# Patient Record
Sex: Male | Born: 1975
Health system: Southern US, Community
[De-identification: ages and names within clinical notes are randomized; demographics above are authoritative.]

## PROBLEM LIST (undated history)

## (undated) DIAGNOSIS — F1510 Other stimulant abuse, uncomplicated: Secondary | ICD-10-CM

## (undated) HISTORY — PX: KNEE ARTHROSCOPY W/ MENISCAL REPAIR: SHX1877

## (undated) HISTORY — PX: EYE SURGERY: SHX253

## (undated) HISTORY — PX: APPENDECTOMY: SHX54

---

## 2000-11-19 ENCOUNTER — Emergency Department (HOSPITAL_COMMUNITY): Admission: EM | Admit: 2000-11-19 | Discharge: 2000-11-19 | Payer: Self-pay | Admitting: Emergency Medicine

## 2001-02-01 ENCOUNTER — Emergency Department (HOSPITAL_COMMUNITY): Admission: EM | Admit: 2001-02-01 | Discharge: 2001-02-01 | Payer: Self-pay | Admitting: Emergency Medicine

## 2001-02-22 ENCOUNTER — Emergency Department (HOSPITAL_COMMUNITY): Admission: EM | Admit: 2001-02-22 | Discharge: 2001-02-22 | Payer: Self-pay | Admitting: Emergency Medicine

## 2002-08-04 ENCOUNTER — Emergency Department (HOSPITAL_COMMUNITY): Admission: EM | Admit: 2002-08-04 | Discharge: 2002-08-04 | Payer: Self-pay

## 2002-08-08 ENCOUNTER — Ambulatory Visit (HOSPITAL_COMMUNITY): Admission: RE | Admit: 2002-08-08 | Discharge: 2002-08-08 | Payer: Self-pay | Admitting: Family Medicine

## 2006-05-01 ENCOUNTER — Emergency Department (HOSPITAL_COMMUNITY): Admission: EM | Admit: 2006-05-01 | Discharge: 2006-05-01 | Payer: Self-pay | Admitting: Emergency Medicine

## 2017-01-11 ENCOUNTER — Emergency Department (HOSPITAL_BASED_OUTPATIENT_CLINIC_OR_DEPARTMENT_OTHER)
Admission: EM | Admit: 2017-01-11 | Discharge: 2017-01-11 | Disposition: A | Discharge status: Home / Self Care | Payer: Self-pay | Attending: Emergency Medicine | Admitting: Emergency Medicine

## 2017-01-11 ENCOUNTER — Encounter (HOSPITAL_BASED_OUTPATIENT_CLINIC_OR_DEPARTMENT_OTHER): Payer: Self-pay | Admitting: Emergency Medicine

## 2017-01-11 DIAGNOSIS — F17220 Nicotine dependence, chewing tobacco, uncomplicated: Secondary | ICD-10-CM | POA: Insufficient documentation

## 2017-01-11 DIAGNOSIS — M545 Low back pain, unspecified: Secondary | ICD-10-CM

## 2017-01-11 MED ORDER — CYCLOBENZAPRINE HCL 10 MG PO TABS: 10.0000 mg | ORAL_TABLET | Freq: Three times a day (TID) | ORAL | 0 refills | Status: DC | PRN

## 2017-01-11 MED ORDER — NAPROXEN 500 MG PO TABS: 500.0000 mg | ORAL_TABLET | Freq: Two times a day (BID) | ORAL | 0 refills | Status: DC

## 2017-01-11 NOTE — ED Notes (Addendum)
c/o mid and R lumbar back pain gradually progressively worse over last 3 days, also reports some tingling in R thigh. Relates sx to aggravated by work and playing with kids (denies: injury, loss of control of bowel or bladder, radiation, numbness, abd pain, nvd, fever or other sx), temp in triage noted to be 99.9. Pt guarding movements, guarding and bracing movements. Ambulatory with slow cautious steady gait. BIB friend. Took aleve PTA w/o change.   Alert, amiable, NAD, calm, interactive, resps e/u, speaking in clear complete sentences, no dyspnea noted, skin W&D, VSS.

## 2017-01-11 NOTE — ED Notes (Signed)
Pt discharged to home NAD.  

## 2017-01-11 NOTE — ED Notes (Addendum)
[facility] not [facility].

## 2017-01-11 NOTE — ED Notes (Signed)
ED Provider at bedside. 

## 2017-01-11 NOTE — ED Provider Notes (Signed)
[facility] Note   CSN: [id number] Arrival date & time: 01/11/17  1941  By signing my name below, I, [name], attest that this documentation has been prepared under the direction and in the presence of Geoffery Lyons, MD . Electronically Signed: [name], ED Scribe. 01/11/2017. 8:30 PM.    History   Chief Complaint Chief Complaint  Patient presents with  . Back Pain    The history is provided by the patient. No language interpreter was used.  Back Pain   This is a new problem. The current episode started more than 2 days ago. The problem occurs constantly. The problem has been gradually worsening. The pain is associated with no known injury. The pain does not radiate. Pertinent negatives include no bowel incontinence, no bladder incontinence, no dysuria and no weakness. Treatments tried: Aleve. The treatment provided no relief.   HPI Comments:  Steven Pace is a 41 y.o. male who presents to the Emergency Department complaining of constant back pain x 3 days. Pt states that yesterday he was playing football outside and the pain worsened, and he also reports that he was feeding his dog he felt the pain shoot through his back again. Pt complains of associated tingling in his legs. 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 He has taken aleve with no relief. Pt denies urinary symptoms, incontinence, weakness.   History reviewed. No pertinent past medical history.  There are no active problems to display for this patient.   Past Surgical History:  Procedure Laterality Date  . EYE SURGERY         Home Medications    Prior to Admission medications   Not on File    Family History History reviewed. No pertinent family history.  Social History Social History  Substance Use Topics  . Smoking status: Never Smoker  . Smokeless  tobacco: Current User    Types: Chew  . Alcohol use No     Allergies   Ambien [zolpidem tartrate]   Review of Systems Review of Systems  Gastrointestinal: Negative for bowel incontinence.  Genitourinary: Negative for bladder incontinence and dysuria.  Musculoskeletal: Positive for back pain.  Neurological: Negative for weakness.     Physical Exam Updated Vital Signs BP 123/88 (BP Location: Right Arm)   Pulse 100   Temp 99.9 F (37.7 C) (Oral)   Resp 18   Ht 5\' 9"  (1.753 m)   Wt 163 lb (73.9 kg)   SpO2 99%   BMI 24.07 kg/m   Physical Exam  Constitutional: He appears well-developed and well-nourished. No distress.  HENT:  Head: Normocephalic and atraumatic.  Eyes: Conjunctivae are normal.  Cardiovascular: Normal rate.   Pulmonary/Chest: Effort normal.  Abdominal: He exhibits no distension.  Musculoskeletal:  Mild TTP in the soft tissues of lumbar region.   Neurological: He is alert.  DTRs 2+ and symmetrical in both lower extremities. Strength 5/5 both lower extremities. Able to ambulate on heels and toes without difficulty.   Skin: Skin is warm and dry.  Psychiatric: He has a normal mood and affect.  Nursing note and vitals reviewed.    ED Treatments / Results  DIAGNOSTIC STUDIES:  Oxygen Saturation is 99% on RA, normal by my interpretation.    COORDINATION OF CARE:  8:30 PM Discussed treatment plan with pt at bedside and pt  agreed to plan.   Labs (all labs ordered are listed, but only abnormal results are displayed) Labs Reviewed - No data to display  EKG  EKG Interpretation None       Radiology No results found.  Procedures Procedures (including critical care time)  Medications Ordered in ED Medications - No data to display   Initial Impression / Assessment and Plan / ED Course  I have reviewed the triage vital signs and the nursing notes.  Pertinent labs & imaging results that were available during my care of the patient were  reviewed by me and considered in my medical decision making (see chart for details).  Patient presents with complaints of back pain that began in the absence of any injury or trauma. There is nothing that would suggest an emergent situation. There is no weakness and reflexes are symmetrical. There are no bowel or bladder complaints. He will be treated with anti-inflammatories, pain medication, and is to follow-up with primary Dr. if not improving in the next week.  Final Clinical Impressions(s) / ED Diagnoses   Final diagnoses:  None    New Prescriptions New Prescriptions   No medications on file  I personally performed the services described in this documentation, which was scribed in my presence. The recorded information has been reviewed and is accurate.        Geoffery Lyonsouglas Anam Bobby, MD 01/11/17 2306

## 2017-01-11 NOTE — Discharge Instructions (Signed)
Naproxen as prescribed. ° °Flexeril as prescribed as needed for pain not relieved with naproxen. ° °Follow-up with your primary Dr. if you're not improving in the next week. °

## 2017-10-06 ENCOUNTER — Encounter (HOSPITAL_BASED_OUTPATIENT_CLINIC_OR_DEPARTMENT_OTHER): Payer: Self-pay | Admitting: *Deleted

## 2017-10-06 ENCOUNTER — Emergency Department (HOSPITAL_BASED_OUTPATIENT_CLINIC_OR_DEPARTMENT_OTHER)
Admission: EM | Admit: 2017-10-06 | Discharge: 2017-10-06 | Disposition: A | Discharge status: Home / Self Care | Payer: Self-pay | Attending: Emergency Medicine | Admitting: Emergency Medicine

## 2017-10-06 ENCOUNTER — Other Ambulatory Visit: Payer: Self-pay

## 2017-10-06 ENCOUNTER — Emergency Department (HOSPITAL_BASED_OUTPATIENT_CLINIC_OR_DEPARTMENT_OTHER): Payer: Self-pay

## 2017-10-06 DIAGNOSIS — R05 Cough: Secondary | ICD-10-CM | POA: Insufficient documentation

## 2017-10-06 DIAGNOSIS — R112 Nausea with vomiting, unspecified: Secondary | ICD-10-CM | POA: Insufficient documentation

## 2017-10-06 DIAGNOSIS — J029 Acute pharyngitis, unspecified: Secondary | ICD-10-CM | POA: Insufficient documentation

## 2017-10-06 DIAGNOSIS — R059 Cough, unspecified: Secondary | ICD-10-CM

## 2017-10-06 DIAGNOSIS — R42 Dizziness and giddiness: Secondary | ICD-10-CM | POA: Insufficient documentation

## 2017-10-06 DIAGNOSIS — F17220 Nicotine dependence, chewing tobacco, uncomplicated: Secondary | ICD-10-CM | POA: Insufficient documentation

## 2017-10-06 DIAGNOSIS — R1084 Generalized abdominal pain: Secondary | ICD-10-CM | POA: Insufficient documentation

## 2017-10-06 LAB — COMPREHENSIVE METABOLIC PANEL
ALK PHOS: 77 U/L (ref 38–126)
ALT: 12 U/L — AB (ref 17–63)
ANION GAP: 6 (ref 5–15)
AST: 14 U/L — ABNORMAL LOW (ref 15–41)
Albumin: 4.4 g/dL (ref 3.5–5.0)
BUN: 11 mg/dL (ref 6–20)
CALCIUM: 9.1 mg/dL (ref 8.9–10.3)
CHLORIDE: 102 mmol/L (ref 101–111)
CO2: 27 mmol/L (ref 22–32)
CREATININE: 0.82 mg/dL (ref 0.61–1.24)
GFR calc Af Amer: >60 mL/min (ref >60)
Glucose, Bld: 107 mg/dL — ABNORMAL HIGH (ref 65–99)
Potassium: 3.2 mmol/L — ABNORMAL LOW (ref 3.5–5.1)
SODIUM: 135 mmol/L (ref 135–145)
Total Bilirubin: 0.5 mg/dL (ref 0.3–1.2)
Total Protein: 7.5 g/dL (ref 6.5–8.1)

## 2017-10-06 LAB — CBC WITH DIFFERENTIAL/PLATELET
Band Neutrophils: 0 %
Basophils Absolute: 0.1 10*3/uL (ref 0.0–0.1)
Basophils Relative: 1 %
Blasts: 0 %
Eosinophils Absolute: 0.1 10*3/uL (ref 0.0–0.7)
Eosinophils Relative: 1 %
HEMATOCRIT: 39.4 % (ref 39.0–52.0)
HEMOGLOBIN: 13.5 g/dL (ref 13.0–17.0)
LYMPHS PCT: 18 %
Lymphs Abs: 0.9 10*3/uL (ref 0.7–4.0)
MCH: 32.1 pg (ref 26.0–34.0)
MCHC: 34.3 g/dL (ref 30.0–36.0)
MCV: 93.6 fL (ref 78.0–100.0)
MYELOCYTES: 0 %
Metamyelocytes Relative: 0 %
Monocytes Absolute: 0.2 10*3/uL (ref 0.1–1.0)
Monocytes Relative: 3 %
NEUTROS ABS: 3.8 10*3/uL (ref 1.7–7.7)
NEUTROS PCT: 77 %
NRBC: 0 /100{WBCs}
PROMYELOCYTES ABS: 0 %
Platelets: 272 10*3/uL (ref 150–400)
RBC: 4.21 MIL/uL — AB (ref 4.22–5.81)
RDW: 12 % (ref 11.5–15.5)
WBC: 5.1 10*3/uL (ref 4.0–10.5)

## 2017-10-06 LAB — TROPONIN I: Troponin I: <0.03 ng/mL (ref <0.03)

## 2017-10-06 LAB — CBG MONITORING, ED: Glucose-Capillary: 98 mg/dL (ref 65–99)

## 2017-10-06 MED ORDER — SODIUM CHLORIDE 0.9 % IV BOLUS (SEPSIS)
1000.0000 mL | Freq: Once | INTRAVENOUS | Status: AC
  Administered 2017-10-06: 1000 mL via INTRAVENOUS

## 2017-10-06 MED ORDER — ONDANSETRON HCL 4 MG/2ML IJ SOLN: 4.0000 mg | Freq: Once | INTRAMUSCULAR | Status: DC

## 2017-10-06 MED ORDER — METOCLOPRAMIDE HCL 10 MG PO TABS: 10.0000 mg | ORAL_TABLET | Freq: Four times a day (QID) | ORAL | 0 refills | Status: DC

## 2017-10-06 MED ORDER — DICYCLOMINE HCL 10 MG PO CAPS
10.0000 mg | ORAL_CAPSULE | Freq: Once | ORAL | Status: AC
  Administered 2017-10-06: 10 mg via ORAL
  Filled 2017-10-06: qty 1

## 2017-10-06 MED ORDER — METOCLOPRAMIDE HCL 5 MG/ML IJ SOLN
10.0000 mg | Freq: Once | INTRAMUSCULAR | Status: AC
  Administered 2017-10-06: 10 mg via INTRAVENOUS
  Filled 2017-10-06: qty 2

## 2017-10-06 MED ORDER — DICYCLOMINE HCL 20 MG PO TABS: 20.0000 mg | ORAL_TABLET | Freq: Two times a day (BID) | ORAL | 0 refills | Status: DC

## 2017-10-06 MED ORDER — IBUPROFEN 800 MG PO TABS: 800.0000 mg | ORAL_TABLET | Freq: Three times a day (TID) | ORAL | 0 refills | Status: DC | PRN

## 2017-10-06 MED FILL — IBUPROFEN 800 MG TAB: 800 | 7 days supply | Qty: 21 | Fill #0

## 2017-10-06 MED FILL — DICYCLOMINE 20 MG TABLET: 20 | 10 days supply | Qty: 20 | Fill #0

## 2017-10-06 MED FILL — METOCLOPRAMIDE 10 MG TABLET: 10 | 7 days supply | Qty: 30 | Fill #0

## 2017-10-06 NOTE — ED Provider Notes (Signed)
Emergency Department Provider Note   I have reviewed the triage vital signs and the nursing notes.   HISTORY  Chief Complaint Cough   HPI Steven Pace is a 41 y.o. male presents to the emergency department for evaluation of flulike symptoms including sore throat, nausea, vomiting, symptoms have been ongoing for the past several days but became significantly worse yesterday while the patient was at work.  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  His partner at bedside states that he has had nausea and vomiting for couple of days but became much worse yesterday.  He continues to have cough but denies chest pain or difficulty breathing.  He initially went to urgent care where he tested negative for the flu and strep.  He was referred to the emergency department because of looking slightly pale and breathing abnormally, according to the patient. No sick contacts.    History reviewed. No pertinent past medical history.  There are no active problems to display for this patient.   Past Surgical History:  Procedure Laterality Date  . EYE SURGERY      Current Outpatient Rx  . Order #: [id number] Class: Print  . Order #: [id number] Class: Print  . Order #: [id number] Class: Print  . Order #: [id number] Class: Print  . Order #: [id number] Class: Print    Allergies Ambien [zolpidem tartrate]  No family history on file.  Social History Social History   Tobacco Use  . Smoking status: Never Smoker  . Smokeless tobacco: Current User    Types: Chew  Substance Use Topics  . Alcohol use: No  . Drug use: No    Review of Systems  Constitutional: Positive subjective fever/chills. Positive fatigue.  Eyes: No visual changes. ENT: Positive sore throat. Cardiovascular: Denies chest pain. Respiratory: Denies shortness of breath. Gastrointestinal: Positive diffuse burning abdominal pain. Positive nausea and vomiting.  No  diarrhea.  No constipation. Genitourinary: Negative for dysuria. Musculoskeletal: Negative for back pain. Skin: Negative for rash. Neurological: Negative for headaches, focal weakness or numbness.  10-point ROS otherwise negative.  ____________________________________________   PHYSICAL EXAM:  VITAL SIGNS: ED Triage Vitals  Enc Vitals Group     BP 10/06/17 1131 111/76     Pulse Rate 10/06/17 1131 (!) 101     Resp 10/06/17 1131 (!) 22     Temp 10/06/17 1131 97.9 F (36.6 C)     Temp Source 10/06/17 1131 Oral     SpO2 10/06/17 1131 100 %     Weight 10/06/17 1134 147 lb (66.7 kg)     Height 10/06/17 1134 5\' 9"  (1.753 m)     Pain Score 10/06/17 1133 5   Constitutional: Alert and oriented. Well appearing and in no acute distress. Eyes: Conjunctivae are normal.  Head: Atraumatic. Nose: No congestion/rhinnorhea. Mouth/Throat: Mucous membranes are slightly dry.  Oropharynx non-erythematous. Neck: No stridor.  No meningeal signs. Cardiovascular: Tachycardia. Good peripheral circulation. Grossly normal heart sounds.   Respiratory: Normal respiratory effort.  No retractions. Lungs CTAB. Gastrointestinal: Soft with diffuse mild tenderness. No focal tenderness, rebound, or guarding. No distention.  Musculoskeletal: No lower extremity tenderness nor edema. No gross deformities of extremities. Neurologic:  Normal speech and language. No gross focal neurologic deficits are appreciated.  Skin:  Skin is warm, dry and intact. No rash noted.  ____________________________________________   LABS (all labs ordered are listed, but only abnormal results are displayed)  Labs Reviewed  COMPREHENSIVE  METABOLIC PANEL - Abnormal; Notable for the following components:      Result Value   Potassium 3.2 (*)    Glucose, Bld 107 (*)    AST 14 (*)    ALT 12 (*)    All other components within normal limits  CBC WITH DIFFERENTIAL/PLATELET - Abnormal; Notable for the following components:   RBC 4.21  (*)    All other components within normal limits  TROPONIN I  CBG MONITORING, ED   ____________________________________________  EKG   EKG Interpretation  Date/Time:  Tuesday October 06 2017 13:51:15 EST Ventricular Rate:  87 PR Interval:    QRS Duration: 100 QT Interval:  359 QTC Calculation: 432 R Axis:   63 Text Interpretation:  Sinus rhythm ST elev, probable normal early repol pattern Baseline wander in lead(s) V5 V6 No STEMI.  Confirmed by Alona Bene [phone]) on 10/06/2017 7:13:31 PM       ____________________________________________  RADIOLOGY  Dg Chest 2 View  Result Date: 10/06/2017 CLINICAL DATA:  41 year old male with cough for 4 days. EXAM: CHEST  2 VIEW COMPARISON:  CT chest abdomen and pelvis 08/06/2017. FINDINGS: Stable large lung volumes. Mediastinal contours remain normal. Visualized tracheal air column is within normal limits. No pneumothorax, pulmonary edema, pleural effusion or confluent pulmonary opacity. Mild bilateral increased interstitial markings appear stable. No acute osseous abnormality identified. Negative visible bowel gas pattern. IMPRESSION: Mild pulmonary hyperinflation. No acute cardiopulmonary abnormality. Electronically Signed   By: Odessa Fleming M.D.   On: 10/06/2017 12:02    ____________________________________________   PROCEDURES  Procedure(s) performed:   Procedures  None ____________________________________________   INITIAL IMPRESSION / ASSESSMENT AND PLAN / ED COURSE  Pertinent labs & imaging results that were available during my care of the patient were reviewed by me and considered in my medical decision making (see chart for details).  Patient presents emergency department for evaluation of flulike symptoms worsening yesterday.  No focal abdominal tenderness.  Labs are largely marked tachycardia.  He is afebrile with normal blood pressure.  Plan for IV fluids, orthostatic vital signs, labs, and EKG.  The patient had a chest  x-ray ordered from triage which is unremarkable.   03:20 PM Patient feeling much better after IVF. Tolerating PO in the ED. Plan for discharge with supportive care plan and PCP follow up as needed.   At this time, I do not feel there is any life-threatening condition present. I have reviewed and discussed all results (EKG, imaging, lab, urine as appropriate), exam findings with patient. I have reviewed nursing notes and appropriate previous records.  I feel the patient is safe to be discharged home without further emergent workup. Discussed usual and customary return precautions. Patient and family (if present) verbalize understanding and are comfortable with this plan.  Patient will follow-up with their primary care provider. If they do not have a primary care provider, information for follow-up has been provided to them. All questions have been answered.  ____________________________________________  FINAL CLINICAL IMPRESSION(S) / ED DIAGNOSES  Final diagnoses:  Generalized abdominal pain  Non-intractable vomiting with nausea, unspecified vomiting type  Cough     MEDICATIONS GIVEN DURING THIS VISIT:  Medications  sodium chloride 0.9 % bolus 1,000 mL (0 mLs Intravenous Stopped 10/06/17 1435)  metoCLOPramide (REGLAN) injection 10 mg (10 mg Intravenous Given 10/06/17 1351)  dicyclomine (BENTYL) capsule 10 mg (10 mg Oral Given 10/06/17 1351)  sodium chloride 0.9 % bolus 1,000 mL (0 mLs Intravenous Stopped 10/06/17 1534)  Note:  This document was prepared using Dragon voice recognition software and may include unintentional dictation errors.  Alona BeneJoshua Margrett Kalb, MD Emergency Medicine    Cherri Yera, Arlyss RepressJoshua G, MD 10/06/17 [phone]

## 2017-10-06 NOTE — Discharge Instructions (Signed)
You have been seen in the Emergency Department (ED) today for nausea and vomiting.  Your work up today has not shown a clear cause for your symptoms. °You have been prescribed Reglan; please use as prescribed as needed for your nausea. ° °Follow up with your doctor as soon as possible regarding today?s emergent visit and your symptoms of nausea.  ° °Return to the ED if you develop abdominal, bloody vomiting, bloody diarrhea, if you are unable to tolerate fluids due to vomiting, or if you develop other symptoms that concern you. ° °

## 2017-10-06 NOTE — ED Triage Notes (Addendum)
[facility] was seen at [facility] this am for cough and nausea. He had a negative flu and strep. He vomited at work yesterday. He looks pale. He was told to come here for further evaluation.

## 2017-10-14 ENCOUNTER — Emergency Department (HOSPITAL_BASED_OUTPATIENT_CLINIC_OR_DEPARTMENT_OTHER): Admission: EM | Admit: 2017-10-14 | Discharge: 2017-10-14 | Discharge status: Against Medical Advice | Payer: Self-pay

## 2017-10-14 NOTE — ED Notes (Signed)
Pt states he will go to his md in am  Pt did not want to wait after seeing lobby, pt was told that we would see him asap,  Was told if anything changed to come back

## 2018-07-19 ENCOUNTER — Emergency Department (HOSPITAL_BASED_OUTPATIENT_CLINIC_OR_DEPARTMENT_OTHER): Payer: Self-pay

## 2018-07-19 ENCOUNTER — Other Ambulatory Visit: Payer: Self-pay

## 2018-07-19 ENCOUNTER — Encounter (HOSPITAL_BASED_OUTPATIENT_CLINIC_OR_DEPARTMENT_OTHER): Payer: Self-pay | Admitting: *Deleted

## 2018-07-19 ENCOUNTER — Emergency Department (HOSPITAL_BASED_OUTPATIENT_CLINIC_OR_DEPARTMENT_OTHER)
Admission: EM | Admit: 2018-07-19 | Discharge: 2018-07-19 | Disposition: A | Discharge status: Home / Self Care | Payer: Self-pay | Attending: Emergency Medicine | Admitting: Emergency Medicine

## 2018-07-19 DIAGNOSIS — M25561 Pain in right knee: Secondary | ICD-10-CM | POA: Insufficient documentation

## 2018-07-19 DIAGNOSIS — Z5321 Procedure and treatment not carried out due to patient leaving prior to being seen by health care provider: Secondary | ICD-10-CM | POA: Insufficient documentation

## 2018-07-19 NOTE — ED Notes (Signed)
Patient transported to X-ray 

## 2018-07-19 NOTE — ED Triage Notes (Addendum)
Pt co right knee pain w/ injury x 1 hr ago

## 2018-10-21 ENCOUNTER — Emergency Department (HOSPITAL_BASED_OUTPATIENT_CLINIC_OR_DEPARTMENT_OTHER)
Admission: EM | Admit: 2018-10-21 | Discharge: 2018-10-22 | Disposition: A | Discharge status: Home / Self Care | Payer: Self-pay | Attending: Emergency Medicine | Admitting: Emergency Medicine

## 2018-10-21 ENCOUNTER — Encounter (HOSPITAL_BASED_OUTPATIENT_CLINIC_OR_DEPARTMENT_OTHER): Payer: Self-pay | Admitting: Emergency Medicine

## 2018-10-21 ENCOUNTER — Other Ambulatory Visit: Payer: Self-pay

## 2018-10-21 DIAGNOSIS — F32A Depression, unspecified: Secondary | ICD-10-CM

## 2018-10-21 DIAGNOSIS — Z046 Encounter for general psychiatric examination, requested by authority: Secondary | ICD-10-CM | POA: Insufficient documentation

## 2018-10-21 DIAGNOSIS — F329 Major depressive disorder, single episode, unspecified: Secondary | ICD-10-CM | POA: Insufficient documentation

## 2018-10-21 DIAGNOSIS — F17220 Nicotine dependence, chewing tobacco, uncomplicated: Secondary | ICD-10-CM | POA: Insufficient documentation

## 2018-10-21 DIAGNOSIS — Z79899 Other long term (current) drug therapy: Secondary | ICD-10-CM | POA: Insufficient documentation

## 2018-10-21 DIAGNOSIS — R410 Disorientation, unspecified: Secondary | ICD-10-CM | POA: Insufficient documentation

## 2018-10-21 HISTORY — DX: Other stimulant abuse, uncomplicated: F15.10
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
## 2018-10-21 NOTE — ED Triage Notes (Signed)
Pt states he is "not good enough"  Pt states he is having problems and feels like he is not good enough for anyone  Pt denies feeling suicidal but states everyone would be better off if he was not here  Pt is tearful in triage

## 2018-10-22 NOTE — ED Notes (Signed)
TTS talking to pt. 

## 2018-10-22 NOTE — ED Notes (Signed)
Feels like he is a burden to every one  Thinks he would be better off not to be alive,  States he is not suicidal  States he does not have the guts to hurt himself or any one else  Denies having a plan to hurt self
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
## 2019-04-29 ENCOUNTER — Emergency Department (HOSPITAL_BASED_OUTPATIENT_CLINIC_OR_DEPARTMENT_OTHER): Payer: Self-pay

## 2019-04-29 ENCOUNTER — Emergency Department (HOSPITAL_BASED_OUTPATIENT_CLINIC_OR_DEPARTMENT_OTHER)
Admission: EM | Admit: 2019-04-29 | Discharge: 2019-04-29 | Disposition: A | Discharge status: Home / Self Care | Payer: Self-pay | Attending: Emergency Medicine | Admitting: Emergency Medicine

## 2019-04-29 ENCOUNTER — Encounter (HOSPITAL_BASED_OUTPATIENT_CLINIC_OR_DEPARTMENT_OTHER): Payer: Self-pay | Admitting: Emergency Medicine

## 2019-04-29 ENCOUNTER — Other Ambulatory Visit: Payer: Self-pay

## 2019-04-29 DIAGNOSIS — Z79899 Other long term (current) drug therapy: Secondary | ICD-10-CM | POA: Insufficient documentation

## 2019-04-29 DIAGNOSIS — S838X1A Sprain of other specified parts of right knee, initial encounter: Secondary | ICD-10-CM

## 2019-04-29 DIAGNOSIS — Y9289 Other specified places as the place of occurrence of the external cause: Secondary | ICD-10-CM | POA: Insufficient documentation

## 2019-04-29 DIAGNOSIS — S8391XA Sprain of unspecified site of right knee, initial encounter: Secondary | ICD-10-CM | POA: Insufficient documentation

## 2019-04-29 DIAGNOSIS — W19XXXA Unspecified fall, initial encounter: Secondary | ICD-10-CM | POA: Insufficient documentation

## 2019-04-29 DIAGNOSIS — S93401A Sprain of unspecified ligament of right ankle, initial encounter: Secondary | ICD-10-CM | POA: Insufficient documentation

## 2019-04-29 DIAGNOSIS — F17290 Nicotine dependence, other tobacco product, uncomplicated: Secondary | ICD-10-CM | POA: Insufficient documentation

## 2019-04-29 DIAGNOSIS — Y999 Unspecified external cause status: Secondary | ICD-10-CM | POA: Insufficient documentation

## 2019-04-29 DIAGNOSIS — Y9389 Activity, other specified: Secondary | ICD-10-CM | POA: Insufficient documentation

## 2019-04-29 MED ORDER — ACETAMINOPHEN 325 MG PO TABS
650.0000 mg | ORAL_TABLET | Freq: Once | ORAL | Status: AC
  Administered 2019-04-29: 650 mg via ORAL
  Filled 2019-04-29: qty 2

## 2019-04-29 MED ORDER — IBUPROFEN 600 MG PO TABS: 600.0000 mg | ORAL_TABLET | Freq: Four times a day (QID) | ORAL | 0 refills | Status: DC | PRN

## 2019-04-29 MED ORDER — NAPROXEN 250 MG PO TABS
500.0000 mg | ORAL_TABLET | Freq: Once | ORAL | Status: AC
  Administered 2019-04-29: 01:00:00 500 mg via ORAL
  Filled 2019-04-29: qty 2

## 2019-04-29 NOTE — ED Notes (Signed)
PT resting, states does not have a ride until 0600.

## 2019-04-29 NOTE — ED Notes (Signed)
Pt waiting for ride after 7am

## 2019-04-29 NOTE — Discharge Instructions (Signed)
X-rays are not revealing any fractures.  We are treating your injury a sprain.  Follow-up with the doctor if you are not getting better over the next 3 to 5 days.

## 2019-04-29 NOTE — ED Provider Notes (Addendum)
[facility] EMERGENCY DEPARTMENT Provider Note   CSN: [id number] Arrival date & time: 04/29/19  0029    History   Chief Complaint Chief Complaint  Patient presents with  . Fall    HPI Steven Pace is a 43 y.o. male.     HPI 43 year old male with history of meth abuse comes in a chief complaint of fall.  Patient states that he had a mechanical fall while he was outside.  He twisted his ankle and his knee on the right side and is having pain on both sides.  He has history of meniscal surgery on the same side. No other injuries reported.  Past Medical History:  Diagnosis Date  . Methamphetamine abuse ([place])     There are no active problems to display for this patient.   Past Surgical History:  Procedure Laterality Date  . APPENDECTOMY    . EYE SURGERY    . KNEE ARTHROSCOPY W/ MENISCAL REPAIR          Home Medications    Prior to Admission medications   Medication Sig Start Date End Date Taking? Authorizing Provider  hydrOXYzine (ATARAX/VISTARIL) 50 MG tablet Take 50 mg by mouth 2 (two) times a day.   Yes [provider]  TESTOSTERONE IM Inject into the muscle.   Yes [provider]  ibuprofen (ADVIL) 600 MG tablet Take 1 tablet (600 mg total) by mouth every 6 (six) hours as needed. 04/29/19   Varney Biles, MD  naproxen (NAPROSYN) 500 MG tablet Take 1 tablet (500 mg total) by mouth 2 (two) times daily. 01/11/17   Veryl Speak, MD    Family History Family History  Problem Relation Age of Onset  . Cancer Other   . CAD Other     Social History Social History   Tobacco Use  . Smoking status: Never Smoker  . Smokeless tobacco: Current User    Types: Chew, Snuff  Substance Use Topics  . Alcohol use: No  . Drug use: Yes    Types: Methamphetamines     Allergies   Ambien [zolpidem tartrate]   Review of Systems Review of Systems  Constitutional: Positive for activity change.  Musculoskeletal: Positive for arthralgias.     Physical Exam Updated Vital Signs BP 122/86 (BP Location: Right Arm)   Pulse (!) 105   Temp 98 F (36.7 C) (Oral)   Resp 18   Ht 5\' 8"  (1.727 m)   Wt 66 kg   SpO2 99%   BMI 22.12 kg/m   Physical Exam Vitals signs and nursing note reviewed.  Constitutional:      Appearance: He is well-developed.  HENT:     Head: Atraumatic.  Neck:     Musculoskeletal: Neck supple.  Cardiovascular:     Rate and Rhythm: Normal rate.  Pulmonary:     Effort: Pulmonary effort is normal.  Musculoskeletal:        General: Tenderness present. No swelling or deformity.     Comments: Effusion appreciated in the suprapatellar region.  Patient also has tenderness over the medial part of the ankle.  Skin:    General: Skin is warm.  Neurological:     Mental Status: He is alert and oriented to person, place, and time.      ED Treatments / Results  Labs (all labs ordered are listed, but only abnormal results are displayed) Labs Reviewed - No data to display  EKG None  Radiology Dg Ankle Complete Right  Result Date:  04/29/2019 CLINICAL DATA:  Initial evaluation for acute trauma, fall. EXAM: RIGHT ANKLE - COMPLETE 3+ VIEW COMPARISON:  None. FINDINGS: No acute fracture dislocation. Ankle mortise approximated. Talar dome intact. Osseous mineralization normal. No acute soft tissue abnormality. IMPRESSION: No acute osseous abnormality about the right ankle. Electronically Signed   By: Rise MuBenjamin  McClintock M.D.   On: 04/29/2019 01:47   Dg Knee Complete 4 Views Right  Result Date: 04/29/2019 CLINICAL DATA:  Initial evaluation for acute trauma, fall. EXAM: RIGHT KNEE - COMPLETE 4+ VIEW COMPARISON:  Prior radiograph from 07/19/2018. FINDINGS: No acute fracture dislocation. No joint effusion. Minimal spurring at the lateral intercondylar eminence. Joint spaces maintained. Osseous mineralization normal. No visible soft tissue injury. IMPRESSION: No acute osseous abnormality about the right knee.  Electronically Signed   By: Rise MuBenjamin  McClintock M.D.   On: 04/29/2019 01:45    Procedures Procedures (including critical care time)  Medications Ordered in ED Medications  naproxen (NAPROSYN) tablet 500 mg (500 mg Oral Given 04/29/19 0126)  acetaminophen (TYLENOL) tablet 650 mg (650 mg Oral Given 04/29/19 0125)     Initial Impression / Assessment and Plan / ED Course  I have reviewed the triage vital signs and the nursing notes.  Pertinent labs & imaging results that were available during my care of the patient were reviewed by me and considered in my medical decision making (see chart for details).        43 year old male comes in a chief complaint of fall.  He is complaining of pain to his knee and right ankle.  We will order radiographs.  Anticipate this to be a sprain-strain type event.  Final Clinical Impressions(s) / ED Diagnoses   Final diagnoses:  Sprain of right ankle, unspecified ligament, initial encounter  Sprain of other ligament of right knee, initial encounter    ED Discharge Orders         Ordered    ibuprofen (ADVIL) 600 MG tablet  Every 6 hours PRN     04/29/19 0226           Derwood KaplanNanavati, Tarisha Fader, MD 04/29/19 0140    Derwood KaplanNanavati, Sandra Brents, MD 04/29/19 [id number]

## 2019-04-29 NOTE — ED Triage Notes (Signed)
Patient presents with complaints of right knee and ankle pain sp fall approximately 1 hour ago; states tripped on his shoe and fell while walking this evening; states walked to ED after fall.

## 2019-04-29 NOTE — ED Notes (Signed)
Pt states he cannot get in touch with anyone to get home.  Taxi voucher given.

## 2019-06-01 ENCOUNTER — Other Ambulatory Visit: Payer: Self-pay

## 2019-06-01 ENCOUNTER — Encounter (HOSPITAL_BASED_OUTPATIENT_CLINIC_OR_DEPARTMENT_OTHER): Payer: Self-pay | Admitting: Emergency Medicine

## 2019-06-01 ENCOUNTER — Emergency Department (HOSPITAL_BASED_OUTPATIENT_CLINIC_OR_DEPARTMENT_OTHER)
Admission: EM | Admit: 2019-06-01 | Discharge: 2019-06-01 | Disposition: A | Discharge status: Home / Self Care | Payer: Self-pay | Attending: Emergency Medicine | Admitting: Emergency Medicine

## 2019-06-01 DIAGNOSIS — K219 Gastro-esophageal reflux disease without esophagitis: Secondary | ICD-10-CM | POA: Insufficient documentation

## 2019-06-01 DIAGNOSIS — R112 Nausea with vomiting, unspecified: Secondary | ICD-10-CM | POA: Insufficient documentation

## 2019-06-01 DIAGNOSIS — Z791 Long term (current) use of non-steroidal anti-inflammatories (NSAID): Secondary | ICD-10-CM | POA: Insufficient documentation

## 2019-06-01 DIAGNOSIS — Z79899 Other long term (current) drug therapy: Secondary | ICD-10-CM | POA: Insufficient documentation

## 2019-06-01 LAB — COMPREHENSIVE METABOLIC PANEL
ALT: 14 U/L (ref 0–44)
AST: 13 U/L — ABNORMAL LOW (ref 15–41)
Albumin: 3.9 g/dL (ref 3.5–5.0)
Alkaline Phosphatase: 75 U/L (ref 38–126)
Anion gap: 9 (ref 5–15)
BUN: 17 mg/dL (ref 6–20)
CO2: 24 mmol/L (ref 22–32)
Calcium: 8.9 mg/dL (ref 8.9–10.3)
Chloride: 105 mmol/L (ref 98–111)
Creatinine, Ser: 1.06 mg/dL (ref 0.61–1.24)
GFR calc Af Amer: >60 mL/min (ref >60)
GFR calc non Af Amer: >60 mL/min (ref >60)
Glucose, Bld: 91 mg/dL (ref 70–99)
Potassium: 3.5 mmol/L (ref 3.5–5.1)
Sodium: 138 mmol/L (ref 135–145)
Total Bilirubin: 0.2 mg/dL — ABNORMAL LOW (ref 0.3–1.2)
Total Protein: 6.5 g/dL (ref 6.5–8.1)

## 2019-06-01 LAB — CBC WITH DIFFERENTIAL/PLATELET
Abs Immature Granulocytes: 0.01 10*3/uL (ref 0.00–0.07)
Basophils Absolute: 0.1 10*3/uL (ref 0.0–0.1)
Basophils Relative: 2 %
Eosinophils Absolute: 0.3 10*3/uL (ref 0.0–0.5)
Eosinophils Relative: 6 %
HCT: 41.5 % (ref 39.0–52.0)
Hemoglobin: 13.7 g/dL (ref 13.0–17.0)
Immature Granulocytes: 0 %
Lymphocytes Relative: 31 %
Lymphs Abs: 1.9 10*3/uL (ref 0.7–4.0)
MCH: 31.6 pg (ref 26.0–34.0)
MCHC: 33 g/dL (ref 30.0–36.0)
MCV: 95.6 fL (ref 80.0–100.0)
Monocytes Absolute: 0.5 10*3/uL (ref 0.1–1.0)
Monocytes Relative: 9 %
Neutro Abs: 3.3 10*3/uL (ref 1.7–7.7)
Neutrophils Relative %: 52 %
Platelets: 202 10*3/uL (ref 150–400)
RBC: 4.34 MIL/uL (ref 4.22–5.81)
RDW: 11.5 % (ref 11.5–15.5)
WBC: 6.2 10*3/uL (ref 4.0–10.5)
nRBC: 0 % (ref 0.0–0.2)

## 2019-06-01 LAB — LIPASE, BLOOD: Lipase: 35 U/L (ref 11–51)

## 2019-06-01 MED ORDER — OMEPRAZOLE 40 MG PO CPDR: 40.0000 mg | DELAYED_RELEASE_CAPSULE | Freq: Every day | ORAL | 0 refills | Status: AC

## 2019-06-01 MED ORDER — SODIUM CHLORIDE 0.9 % IV BOLUS (SEPSIS)
1000.0000 mL | Freq: Once | INTRAVENOUS | Status: AC
  Administered 2019-06-01: 04:00:00 1000 mL via INTRAVENOUS

## 2019-06-01 MED ORDER — ONDANSETRON HCL 4 MG PO TABS: 4.0000 mg | ORAL_TABLET | Freq: Four times a day (QID) | ORAL | 0 refills | Status: AC | PRN

## 2019-06-01 MED ORDER — ONDANSETRON HCL 4 MG/2ML IJ SOLN
4.0000 mg | Freq: Once | INTRAMUSCULAR | Status: AC
Start: 2019-06-01 — End: 2019-06-01
  Administered 2019-06-01: 4 mg via INTRAVENOUS
  Filled 2019-06-01: qty 2

## 2019-06-01 MED ORDER — PANTOPRAZOLE SODIUM 40 MG IV SOLR
40.0000 mg | Freq: Once | INTRAVENOUS | Status: AC
  Administered 2019-06-01: 04:00:00 40 mg via INTRAVENOUS
  Filled 2019-06-01: qty 40

## 2019-06-01 NOTE — ED Triage Notes (Signed)
Patient presents with complaints of sudden onset NV and abd pain sp eating chips and beef stew about 2 hours pta. States feels like his stomach is on fire. No active vomiting noted at this time.

## 2019-06-01 NOTE — ED Provider Notes (Signed)
TIME SEEN: 3:28 AM  CHIEF COMPLAINT: Nausea, vomiting, chest and abdominal burning  HPI: Patient is a 43 year old male with history of methamphetamine abuse who presents to the emergency department with nausea and vomiting that started approximately 2 hours after eating tortilla chips and beef stew.  States he feels like his stomach is "on fire".  He is having burning chest pain as well.  No diarrhea but states he feels like that is coming.  No fevers.  No difficulty breathing.  Has had previous appendectomy.  ROS: See HPI Constitutional: no fever  Eyes: no drainage  ENT: no runny nose   Cardiovascular:   chest pain  Resp: no SOB  GI:  vomiting GU: no dysuria Integumentary: no rash  Allergy: no hives  Musculoskeletal: no leg swelling  Neurological: no slurred speech ROS otherwise negative  PAST MEDICAL HISTORY/PAST SURGICAL HISTORY:  Past Medical History:  Diagnosis Date  . Methamphetamine abuse ([place])     MEDICATIONS:  Prior to Admission medications   Medication Sig Start Date End Date Taking? Authorizing Provider  hydrOXYzine (ATARAX/VISTARIL) 50 MG tablet Take 50 mg by mouth 2 (two) times a day.    [provider]  ibuprofen (ADVIL) 600 MG tablet Take 1 tablet (600 mg total) by mouth every 6 (six) hours as needed. 04/29/19   Varney Biles, MD  naproxen (NAPROSYN) 500 MG tablet Take 1 tablet (500 mg total) by mouth 2 (two) times daily. 01/11/17   Veryl Speak, MD  TESTOSTERONE IM Inject into the muscle.    [provider]    ALLERGIES:  Allergies  Allergen Reactions  . Ambien [Zolpidem Tartrate] Other (See Comments)    "it knocked me out for a long period of time"    SOCIAL HISTORY:  Social History   Tobacco Use  . Smoking status: Never Smoker  . Smokeless tobacco: Current User    Types: Chew, Snuff  Substance Use Topics  . Alcohol use: No    FAMILY HISTORY: Family History  Problem Relation Age of Onset  . Cancer Other   . CAD Other      EXAM: BP 124/83 (BP Location: Right Arm)   Pulse 97   Temp 98.2 F (36.8 C) (Oral)   Resp 18   Ht 5\' 8"  (1.727 m)   Wt 66 kg   SpO2 98%   BMI 22.12 kg/m  CONSTITUTIONAL: Alert and oriented and responds appropriately to questions. Well-appearing; well-nourished HEAD: Normocephalic EYES: Conjunctivae clear, pupils appear equal, EOMI ENT: normal nose; moist mucous membranes NECK: Supple, no meningismus, no nuchal rigidity, no LAD  CARD: RRR; S1 and S2 appreciated; no murmurs, no clicks, no rubs, no gallops RESP: Normal chest excursion without splinting or tachypnea; breath sounds clear and equal bilaterally; no wheezes, no rhonchi, no rales, no hypoxia or respiratory distress, speaking full sentences ABD/GI: Normal bowel sounds; non-distended; soft, non-tender, no rebound, no guarding, no peritoneal signs, no hepatosplenomegaly BACK:  The back appears normal and is non-tender to palpation, there is no CVA tenderness EXT: Normal ROM in all joints; non-tender to palpation; no edema; normal capillary refill; no cyanosis, no calf tenderness or swelling    SKIN: Normal color for age and race; warm; no rash NEURO: Moves all extremities equally PSYCH: The patient's mood and manner are appropriate. Grooming and personal hygiene are appropriate.  MEDICAL DECISION MAKING: Patient here with complaints of abdominal pain, chest pain that feels like a burning pain with nausea and vomiting.  Abdominal exam benign.  Suspect  bad food exposure, viral illness, acid reflux.  Will treat symptomatically with IV fluids, Protonix, Zofran.  Less likely cholecystitis, pancreatitis, bowel obstruction, colitis.  Will obtain basic labs.  EKG ordered given patient complaining of chest pain but seems very atypical for ACS, PE or dissection.  Likely from acid reflux.  ED PROGRESS: Patient's work-up is unremarkable.  Normal LFTs, lipase.  EKG shows no ischemic abnormality.  Patient reports feeling better after Protonix  and Zofran.  Will discharge home with prescriptions of Zofran and omeprazole.  Discussed return precautions.  Recommended bland diet.   At this time, I do not feel there is any life-threatening condition present. I have reviewed and discussed all results (EKG, imaging, lab, urine as appropriate) and exam findings with patient/family. I have reviewed nursing notes and appropriate previous records.  I feel the patient is safe to be discharged home without further emergent workup and can continue workup as an outpatient as needed. Discussed usual and customary return precautions. Patient/family verbalize understanding and are comfortable with this plan.  Outpatient follow-up has been provided as needed. All questions have been answered.      EKG Interpretation  Date/Time:  Wednesday June 01 2019 04:04:29 EDT Ventricular Rate:  80 PR Interval:    QRS Duration: 96 QT Interval:  354 QTC Calculation: 409 R Axis:   23 Text Interpretation:  Sinus rhythm Biatrial enlargement No significant change since last tracing Confirmed by Windel Keziah, Baxter HireKristen [phone]([id number]) on 06/01/2019 4:26:57 AM         Denzil Mceachron, Layla MawKristen N, DO 06/01/19 [id number]

## 2019-10-10 ENCOUNTER — Other Ambulatory Visit: Payer: Self-pay

## 2019-10-10 ENCOUNTER — Encounter (HOSPITAL_BASED_OUTPATIENT_CLINIC_OR_DEPARTMENT_OTHER): Payer: Self-pay | Admitting: *Deleted

## 2019-10-10 ENCOUNTER — Emergency Department (HOSPITAL_BASED_OUTPATIENT_CLINIC_OR_DEPARTMENT_OTHER)
Admission: EM | Admit: 2019-10-10 | Discharge: 2019-10-10 | Disposition: A | Discharge status: Home / Self Care | Payer: Self-pay | Attending: Emergency Medicine | Admitting: Emergency Medicine

## 2019-10-10 DIAGNOSIS — T300 Burn of unspecified body region, unspecified degree: Secondary | ICD-10-CM

## 2019-10-10 DIAGNOSIS — T23121A Burn of first degree of single right finger (nail) except thumb, initial encounter: Secondary | ICD-10-CM | POA: Insufficient documentation

## 2019-10-10 DIAGNOSIS — Y9389 Activity, other specified: Secondary | ICD-10-CM | POA: Insufficient documentation

## 2019-10-10 DIAGNOSIS — Z79899 Other long term (current) drug therapy: Secondary | ICD-10-CM | POA: Insufficient documentation

## 2019-10-10 DIAGNOSIS — X19XXXA Contact with other heat and hot substances, initial encounter: Secondary | ICD-10-CM | POA: Insufficient documentation

## 2019-10-10 DIAGNOSIS — Y929 Unspecified place or not applicable: Secondary | ICD-10-CM | POA: Insufficient documentation

## 2019-10-10 DIAGNOSIS — F17220 Nicotine dependence, chewing tobacco, uncomplicated: Secondary | ICD-10-CM | POA: Insufficient documentation

## 2019-10-10 DIAGNOSIS — Y999 Unspecified external cause status: Secondary | ICD-10-CM | POA: Insufficient documentation

## 2019-10-10 MED ORDER — SILVER SULFADIAZINE 1 % EX CREA: 1.0000 "application " | TOPICAL_CREAM | Freq: Every day | CUTANEOUS | 0 refills | Status: AC

## 2019-10-10 MED ORDER — NAPROXEN 500 MG PO TABS: 500.0000 mg | ORAL_TABLET | Freq: Two times a day (BID) | ORAL | 0 refills | Status: AC
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
## 2019-12-30 ENCOUNTER — Encounter (HOSPITAL_BASED_OUTPATIENT_CLINIC_OR_DEPARTMENT_OTHER): Payer: Self-pay | Admitting: *Deleted

## 2019-12-30 ENCOUNTER — Other Ambulatory Visit: Payer: Self-pay

## 2019-12-30 ENCOUNTER — Emergency Department (HOSPITAL_BASED_OUTPATIENT_CLINIC_OR_DEPARTMENT_OTHER): Payer: Self-pay

## 2019-12-30 ENCOUNTER — Emergency Department (HOSPITAL_BASED_OUTPATIENT_CLINIC_OR_DEPARTMENT_OTHER)
Admission: EM | Admit: 2019-12-30 | Discharge: 2019-12-30 | Discharge status: Against Medical Advice | Payer: Self-pay | Attending: Emergency Medicine | Admitting: Emergency Medicine

## 2019-12-30 DIAGNOSIS — S61219A Laceration without foreign body of unspecified finger without damage to nail, initial encounter: Secondary | ICD-10-CM

## 2019-12-30 DIAGNOSIS — Y929 Unspecified place or not applicable: Secondary | ICD-10-CM | POA: Insufficient documentation

## 2019-12-30 DIAGNOSIS — F17220 Nicotine dependence, chewing tobacco, uncomplicated: Secondary | ICD-10-CM | POA: Insufficient documentation

## 2019-12-30 DIAGNOSIS — S61411A Laceration without foreign body of right hand, initial encounter: Secondary | ICD-10-CM

## 2019-12-30 DIAGNOSIS — Y9389 Activity, other specified: Secondary | ICD-10-CM | POA: Insufficient documentation

## 2019-12-30 DIAGNOSIS — S61312A Laceration without foreign body of right middle finger with damage to nail, initial encounter: Secondary | ICD-10-CM | POA: Insufficient documentation

## 2019-12-30 DIAGNOSIS — W270XXA Contact with workbench tool, initial encounter: Secondary | ICD-10-CM | POA: Insufficient documentation

## 2019-12-30 DIAGNOSIS — S61214A Laceration without foreign body of right ring finger without damage to nail, initial encounter: Secondary | ICD-10-CM | POA: Insufficient documentation

## 2019-12-30 DIAGNOSIS — Y999 Unspecified external cause status: Secondary | ICD-10-CM | POA: Insufficient documentation

## 2019-12-30 MED ORDER — LIDOCAINE HCL (PF) 1 % IJ SOLN
10.0000 mL | Freq: Once | INTRAMUSCULAR | Status: DC
  Filled 2019-12-30: qty 10

## 2019-12-30 MED ORDER — CEPHALEXIN 500 MG PO CAPS: 500.0000 mg | ORAL_CAPSULE | Freq: Four times a day (QID) | ORAL | 0 refills | Status: AC
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
## 2019-12-30 NOTE — ED Notes (Signed)
Patient informed of procedure by provider. Patient refuses further care or evaluation. Patient informed of risks.

## 2019-12-30 NOTE — Discharge Instructions (Signed)
You chose to check out AGAINST MEDICAL ADVICE today, but please take antibiotics as prescribed to help prevent infection.  We were not able to apply medication to help stop bleeding today, but please keep dressing in place for 24 hours and then change daily to monitor for signs of infection.  Please return to the emergency department at any time for further care.

## 2019-12-30 NOTE — ED Provider Notes (Signed)
[facility] EMERGENCY DEPARTMENT Provider Note   CSN: [id number] Arrival date & time: 12/30/19  1912     History Chief Complaint  Patient presents with  . Finger Injury    Steven Pace is a 44 y.o. male.  Steven Pace is a 44 y.o. male with a history of methamphetamine abuse, who presents to the emergency department for evaluation of lacerations to his right middle and ring finger.  He states this morning around 7 AM he and a friend were cutting wood with a hatchet, he went to grab what is it was falling down and the hatchet caught the tips of his fingers.  He reports he did not look at it much, but it was bleeding so wrapped it up with gauze and electrical tape and continued working.  He states he has had some pain to the fingertips throughout the day, when he tried to get the bandage off it was stuck and the wounds had blood through the bandage and it was very painful and he could not get it off himself so came in for further evaluation.  Thinks tetanus might be up-to-date but is unsure.        Past Medical History:  Diagnosis Date  . Methamphetamine abuse ([facility])     There are no problems to display for this patient.   Past Surgical History:  Procedure Laterality Date  . APPENDECTOMY    . EYE SURGERY    . KNEE ARTHROSCOPY W/ MENISCAL REPAIR         Family History  Problem Relation Age of Onset  . Cancer Other   . CAD Other     Social History   Tobacco Use  . Smoking status: Never Smoker  . Smokeless tobacco: Current User    Types: Chew, Snuff  Substance Use Topics  . Alcohol use: No  . Drug use: Yes    Types: Methamphetamines    Home Medications Prior to Admission medications   Medication Sig Start Date End Date Taking? Authorizing Provider  cephALEXin (KEFLEX) 500 MG capsule Take 1 capsule (500 mg total) by mouth 4 (four) times daily. 12/30/19   Jacqlyn Larsen, PA-C  hydrOXYzine (ATARAX/VISTARIL) 50 MG tablet Take 50 mg by mouth 2 (two)  times a day.    [provider]  naproxen (NAPROSYN) 500 MG tablet Take 1 tablet (500 mg total) by mouth 2 (two) times daily. 10/10/19   Petrucelli, Samantha R, PA-C  omeprazole (PRILOSEC) 40 MG capsule Take 1 capsule (40 mg total) by mouth daily. 06/01/19   Ward, Delice Bison, DO  ondansetron (ZOFRAN) 4 MG tablet Take 1 tablet (4 mg total) by mouth every 6 (six) hours as needed for nausea or vomiting. 06/01/19   Ward, Delice Bison, DO  silver sulfADIAZINE (SILVADENE) 1 % cream Apply 1 application topically daily. 10/10/19   Petrucelli, Samantha R, PA-C  TESTOSTERONE IM Inject into the muscle.    [provider]    Allergies    Ambien [zolpidem tartrate]  Review of Systems   Review of Systems  Constitutional: Negative for chills and fever.  Skin: Positive for wound.  Neurological: Negative for weakness and numbness.    Physical Exam Updated Vital Signs BP 120/82 (BP Location: Right Arm)   Pulse (!) 110   Temp 98.4 F (36.9 C) (Oral)   Resp 18   Ht 5\' 8"  (1.727 m)   Wt 70.3 kg   SpO2 100%   BMI 23.57 kg/m  Physical Exam Vitals and nursing note reviewed.  Constitutional:      General: He is not in acute distress.    Appearance: Normal appearance. He is well-developed and normal weight. He is not ill-appearing or diaphoretic.  HENT:     Head: Normocephalic and atraumatic.  Eyes:     General:        Right eye: No discharge.        Left eye: No discharge.  Pulmonary:     Effort: Pulmonary effort is normal. No respiratory distress.  Musculoskeletal:     Comments: There is a portion of the nail and surrounding nailbed that is cut away of the middle finger with active bleeding, no visible bone, but tissue is not repairable.  On the fourth finger there is a small portion of the fingertip that is cut away again without repairable tissue.  There is some active bleeding here as well.  Patient is able to flex and extend the fingers and has normal sensation in both  fingertips.  Skin:    General: Skin is warm and dry.     Comments: Numerous tattoos noted.  Neurological:     Mental Status: He is alert and oriented to person, place, and time.     Coordination: Coordination normal.  Psychiatric:        Mood and Affect: Mood normal.        Behavior: Behavior normal.     ED Results / Procedures / Treatments   Labs (all labs ordered are listed, but only abnormal results are displayed) Labs Reviewed - No data to display  EKG None  Radiology DG Hand Complete Right  Result Date: 12/30/2019 CLINICAL DATA:  Third and fourth finger lacerations after injury this morning. EXAM: RIGHT HAND - COMPLETE 3+ VIEW COMPARISON:  None. FINDINGS: No fracture or dislocation. No focal osseous lesions. No osseous erosions or periosteal reaction. No significant arthropathy. No radiopaque foreign body. IMPRESSION: No fracture, malalignment or radiopaque foreign body. Electronically Signed   By: Delbert Phenix M.D.   On: 12/30/2019 20:13    Procedures Procedures (including critical care time)  Medications Ordered in ED Medications  lidocaine (PF) (XYLOCAINE) 1 % injection 10 mL (10 mLs Infiltration Refused 12/30/19 2037)    ED Course  I have reviewed the triage vital signs and the nursing notes.  Pertinent labs & imaging results that were available during my care of the patient were reviewed by me and considered in my medical decision making (see chart for details).    MDM Rules/Calculators/A&P                     44 year old male arrives with lacerations to the right third and fourth fingers which occurred around 7 AM this morning when he ran to work cutting wood with a hatchet.  He states when it first happened he just wrapped up the fingers with gauze and electrical tape and kept working, but when he got home he could not remove the gauze and the fingers had blood through the dressing.  I was able to soak and remove the dressing and evaluate the fingers and he has  tissue cut away from the tip of the fourth finger head from the nail and nailbed of the third, this is a mandolin type injury and there is not repairable tissue that would benefit from sutured repair, but both areas continue to bleed.  Neurovascularly intact.  X-rays without evidence of fracture and there is no visible  bone where the nailbed was cut away.  Discussed plan with patient to digital block each finger, clean wounds copiously and then apply wound seal powder to help control bleeding so that the area can clot off and heal.  Patient became very upset and tearful stating that he could not deal with the numbing medication, I explained to patient that the wound seal powder burns and is usually very painful and the numbing medication will sting and burn for about 45 seconds and then will help to numb the fingers.  Patient refuses to allow digital block or any numbing pattern to be applied.  I discussed with patient that fingers could continue to bleed, fingers could become infected, this could lead to him potentially losing fingers or infection spreading and becoming life-threatening.  Patient expresses understanding of this and has full decision-making capacity but refuses any further care intervention for his lacerations, patient will sign out AGAINST MEDICAL ADVICE.  Dressings applied and given prescription for Keflex for infection prevention and encouraged to return to the emergency department for further care of these wounds at any time.  Final Clinical Impression(s) / ED Diagnoses Final diagnoses:  Laceration of multiple sites of right hand and fingers, initial encounter    Rx / DC Orders ED Discharge Orders         Ordered    cephALEXin (KEFLEX) 500 MG capsule  4 times daily     12/30/19 2036           Dartha Lodge, Cordelia Poche 12/30/19 2059    Terald Sleeper, MD 12/30/19 2255

## 2021-02-04 IMAGING — DX RIGHT ANKLE - COMPLETE 3+ VIEW
3 series · 3 of 3 positions shown · non-contrast
Comparison: None.

CLINICAL DATA: Initial evaluation for acute trauma, fall.

EXAM:
RIGHT ANKLE - COMPLETE 3+ VIEW

[ankle ap]
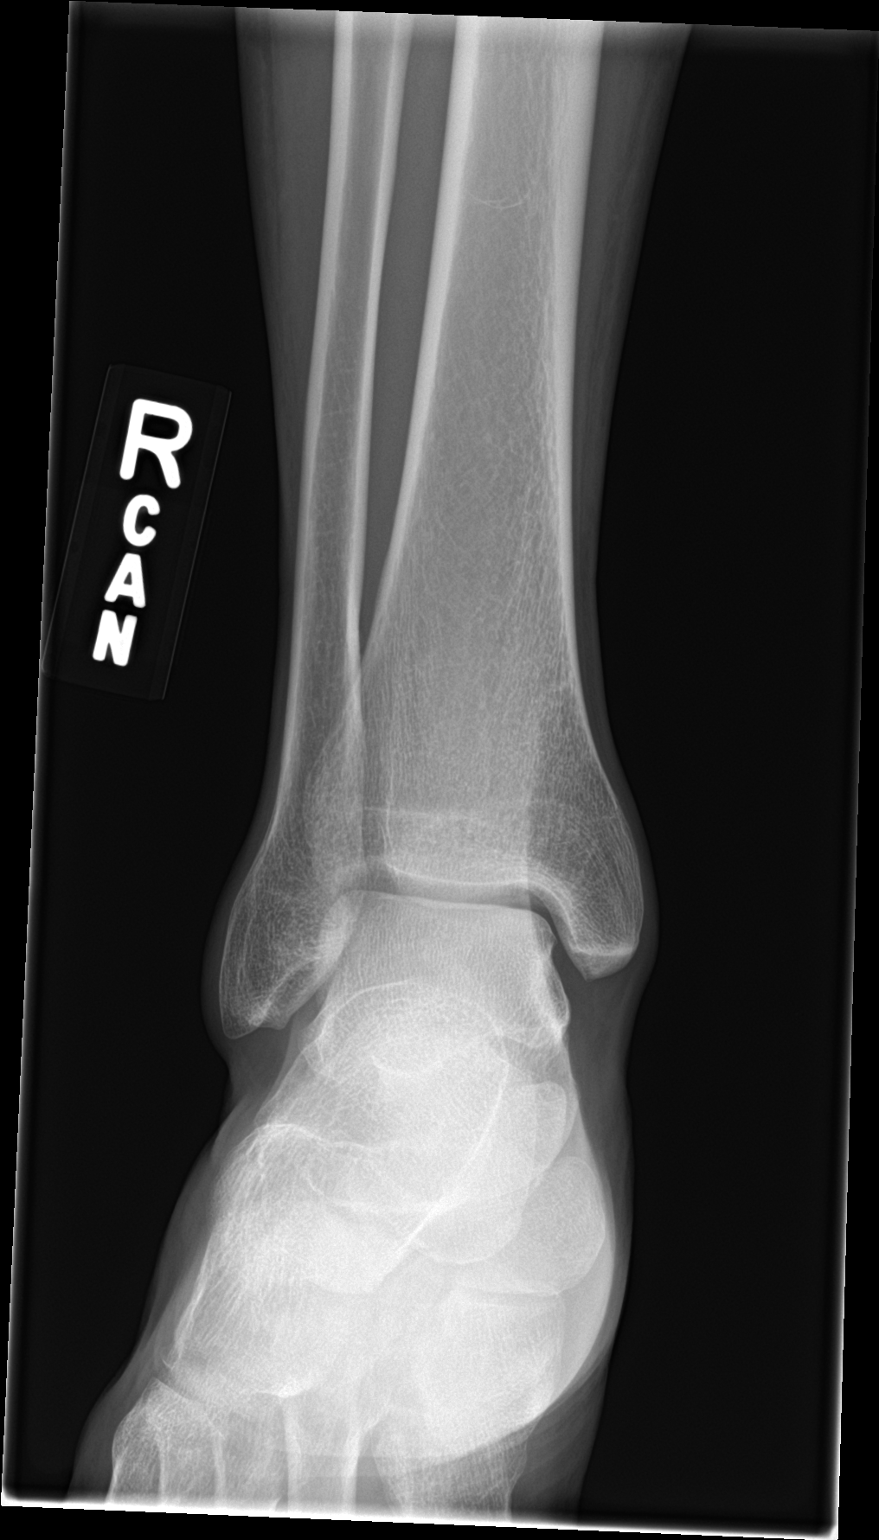

[ankle obl]
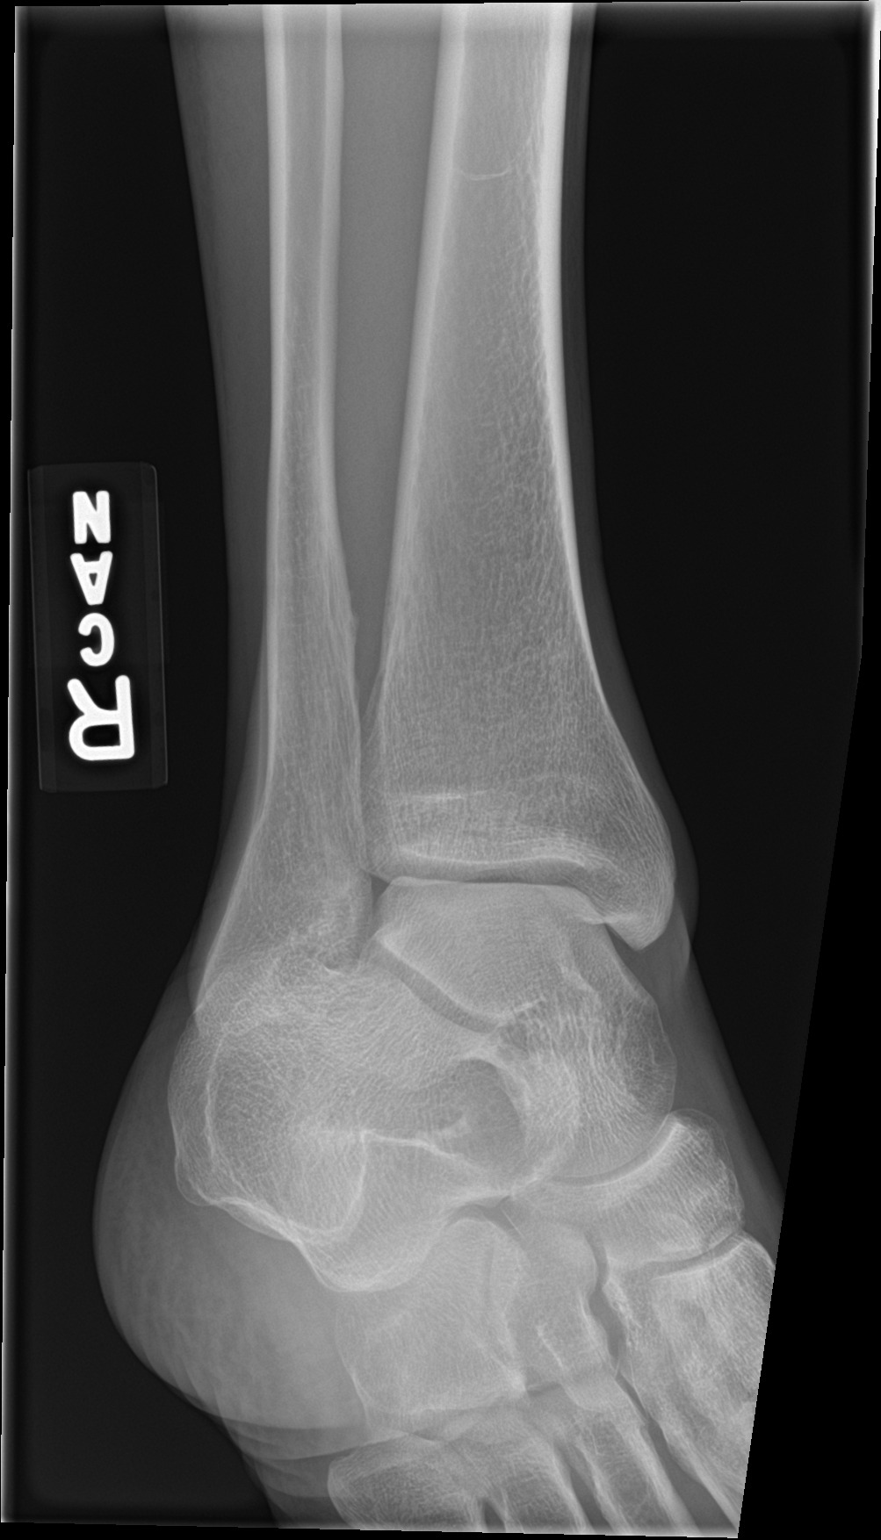

[ankle lat]
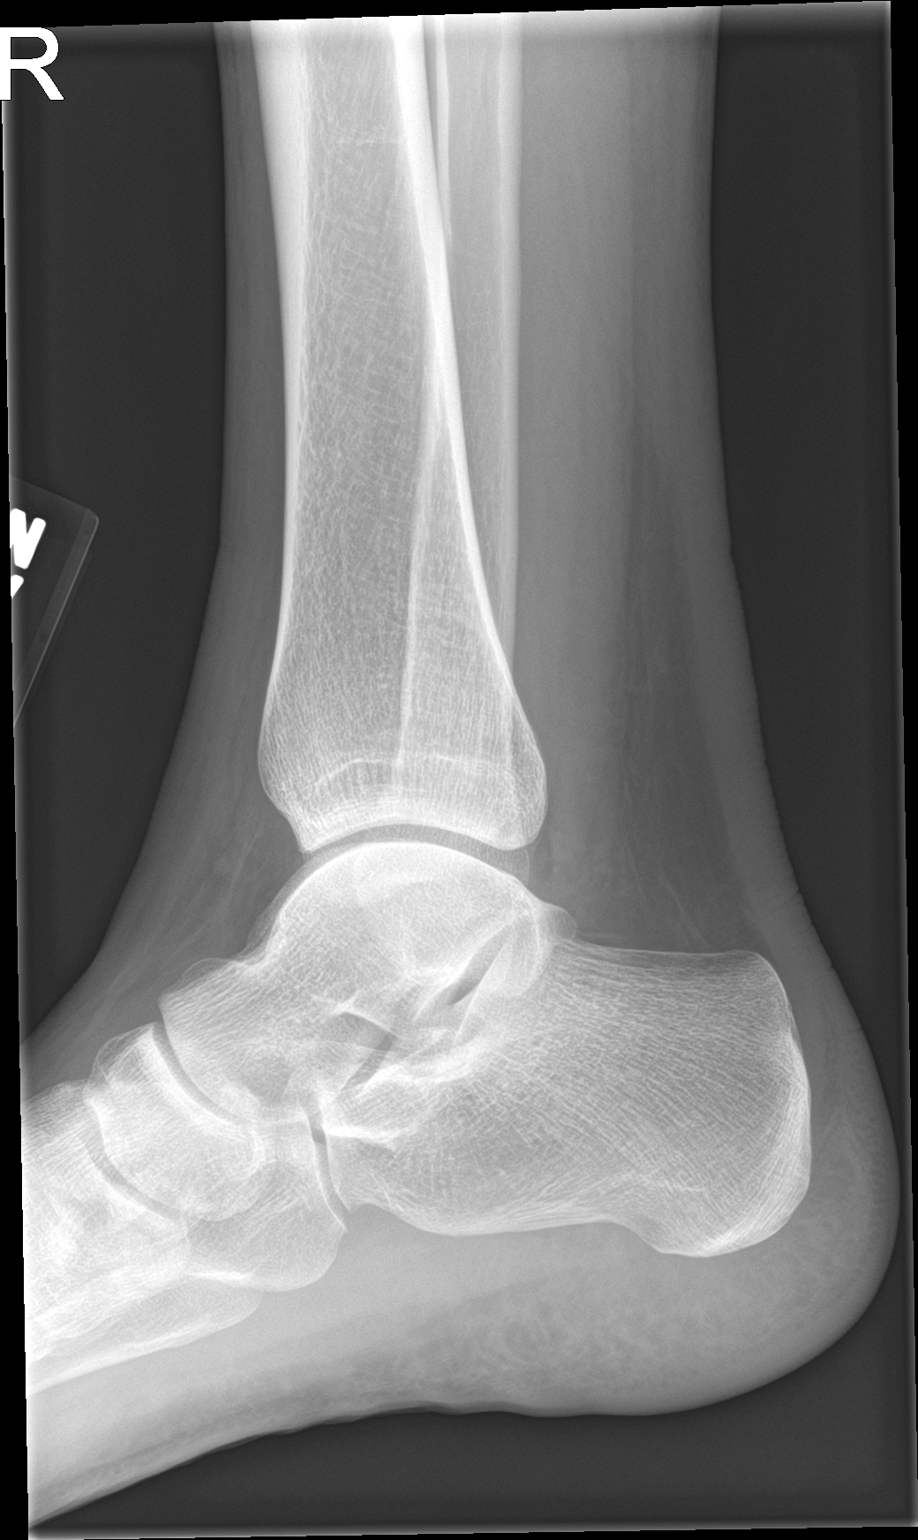

[3 of 3 positions shown; findings below may reference images not displayed]

FINDINGS: No acute fracture dislocation. Ankle mortise approximated. Talar
dome intact. Osseous mineralization normal. No acute soft tissue
abnormality.
IMPRESSION: No acute osseous abnormality about the right ankle.

## 2021-02-04 IMAGING — DX RIGHT KNEE - COMPLETE 4+ VIEW
4 series · 4 of 4 positions shown · non-contrast
Comparison: Prior radiograph from 07/19/2018.

CLINICAL DATA: Initial evaluation for acute trauma, fall.

EXAM:
RIGHT KNEE - COMPLETE 4+ VIEW

[knee ap]
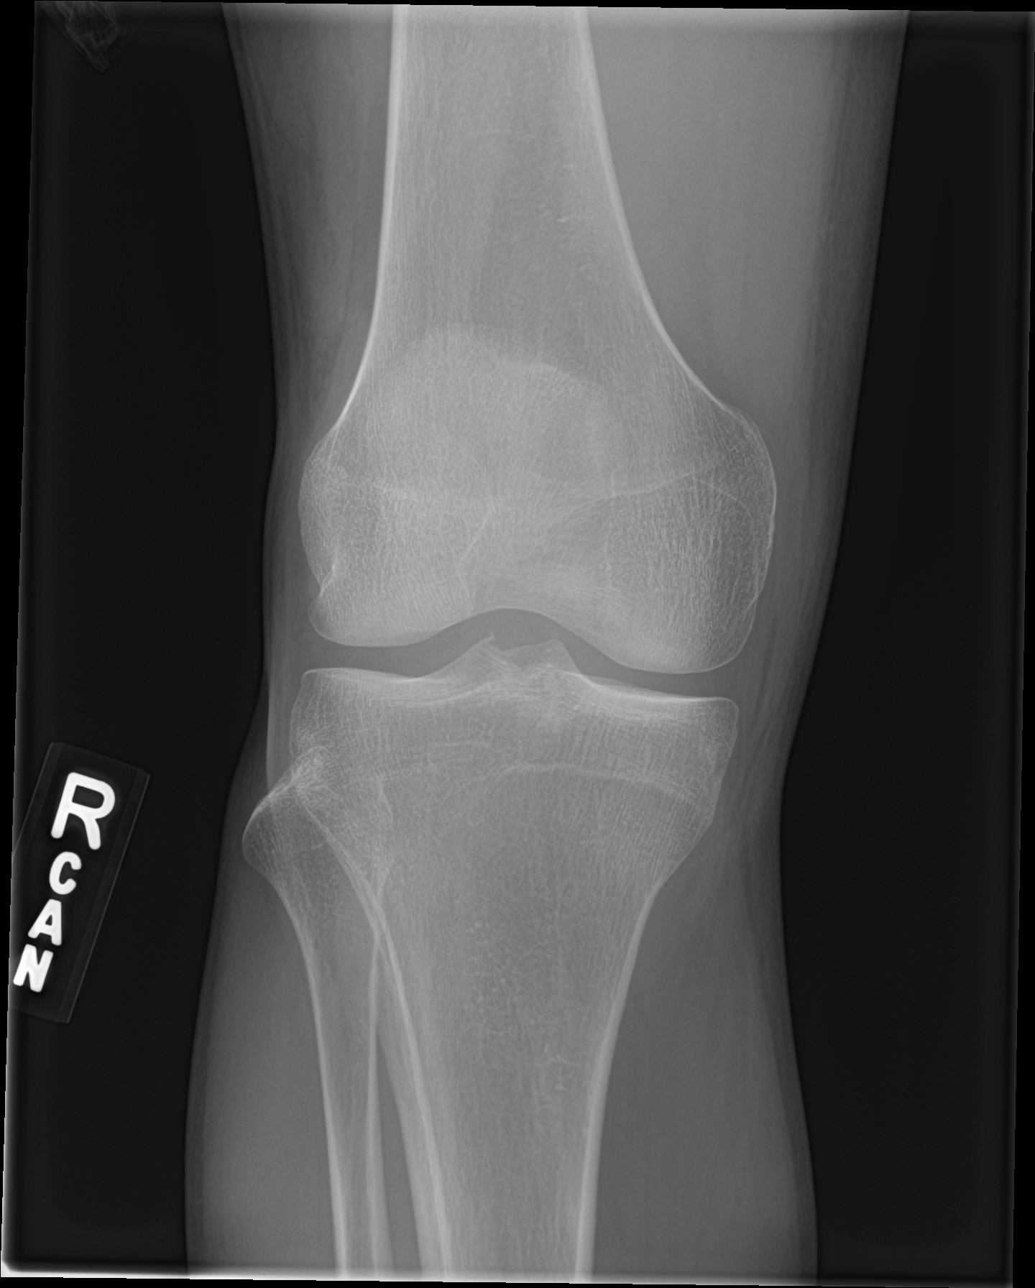

[knee lat]
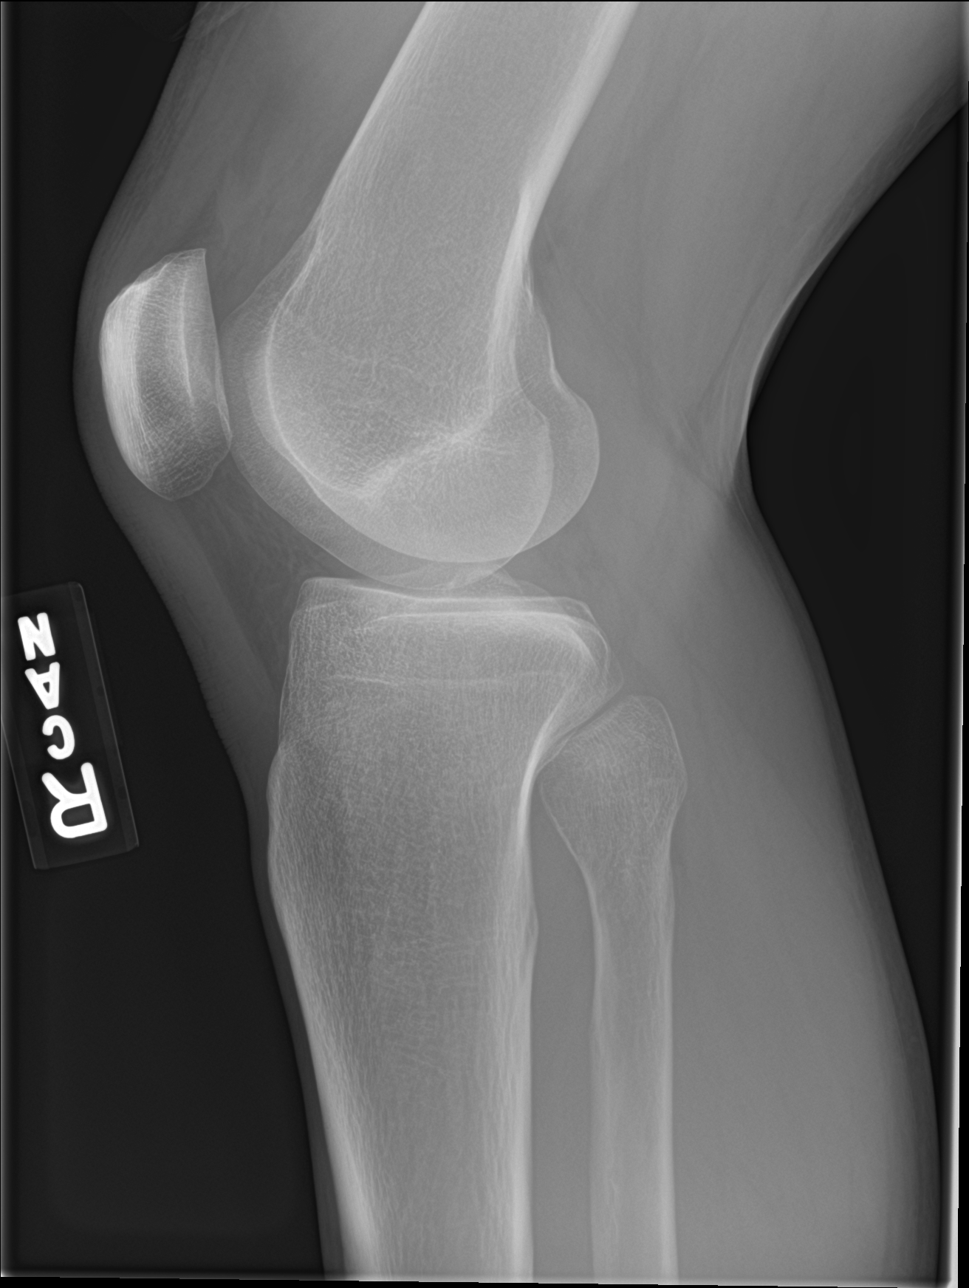

[knee obl (1 of 2)]
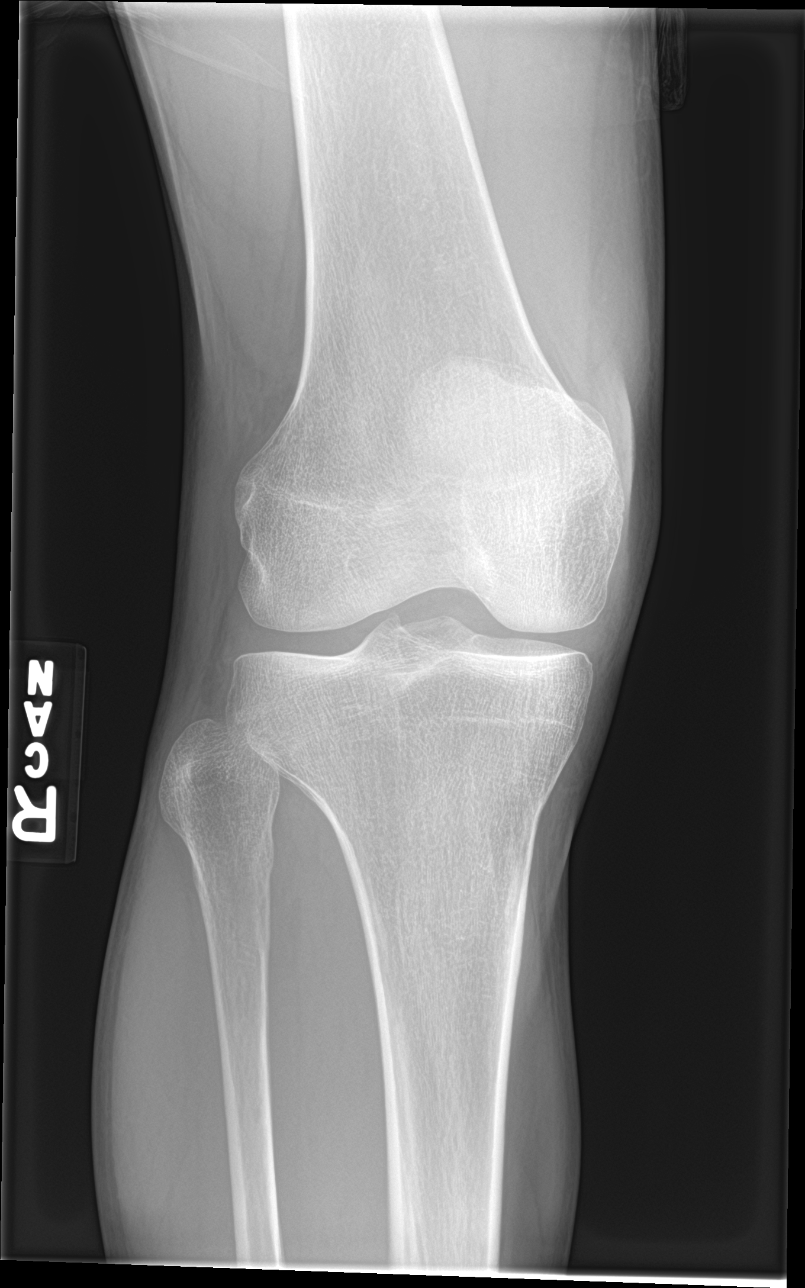

[knee obl (2 of 2)]
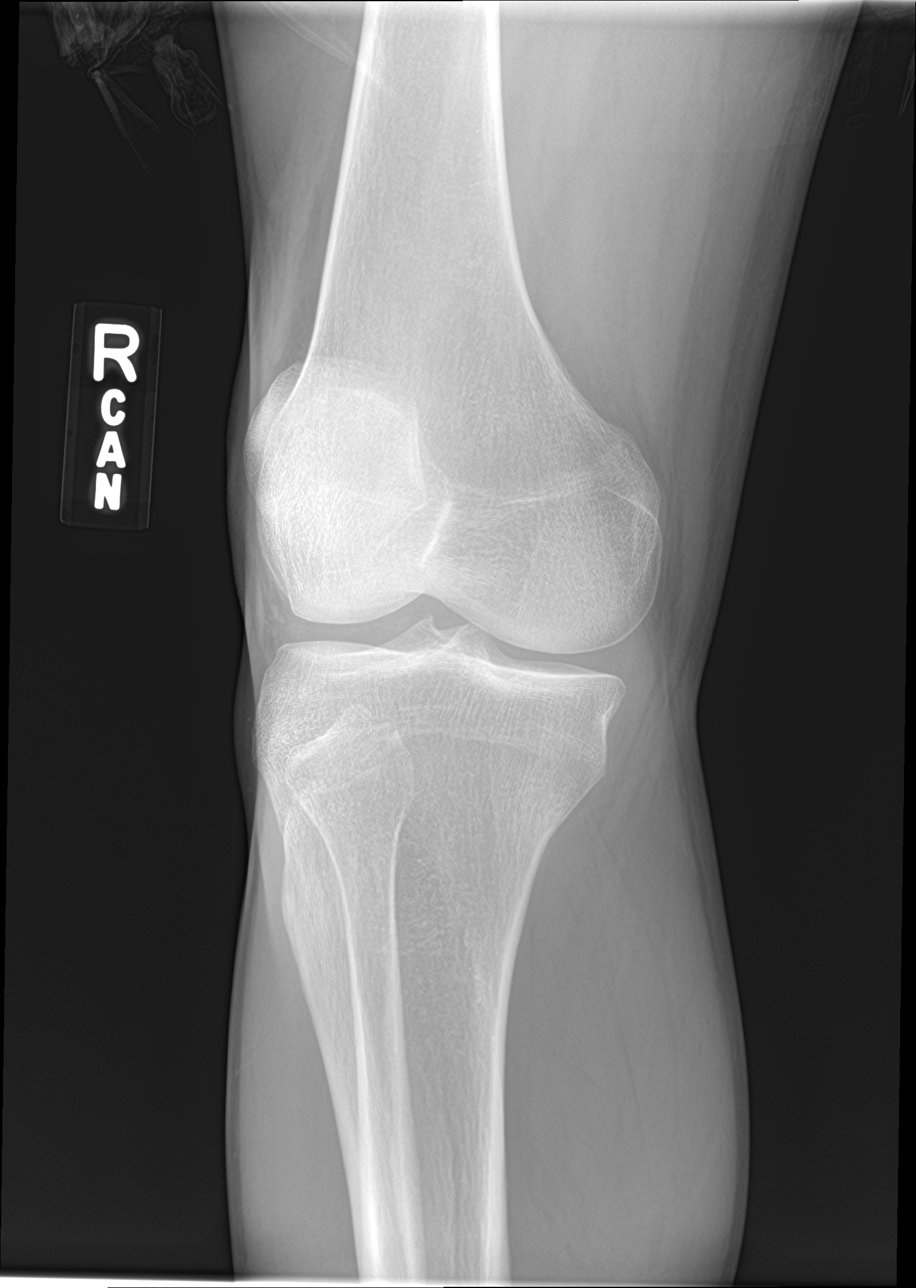

[4 of 4 positions shown; findings below may reference images not displayed]

FINDINGS: No acute fracture dislocation. No joint effusion. Minimal spurring
at the lateral intercondylar eminence. Joint spaces maintained.
Osseous mineralization normal. No visible soft tissue injury.
IMPRESSION: No acute osseous abnormality about the right knee.

## 2021-04-08 ENCOUNTER — Other Ambulatory Visit: Payer: Self-pay

## 2021-04-08 ENCOUNTER — Emergency Department (HOSPITAL_BASED_OUTPATIENT_CLINIC_OR_DEPARTMENT_OTHER)
Admission: EM | Admit: 2021-04-08 | Discharge: 2021-04-08 | Disposition: A | Discharge status: Home / Self Care | Payer: Self-pay | Attending: Emergency Medicine | Admitting: Emergency Medicine

## 2021-04-08 ENCOUNTER — Encounter (HOSPITAL_BASED_OUTPATIENT_CLINIC_OR_DEPARTMENT_OTHER): Payer: Self-pay | Admitting: *Deleted

## 2021-04-08 DIAGNOSIS — M549 Dorsalgia, unspecified: Secondary | ICD-10-CM

## 2021-04-08 DIAGNOSIS — M546 Pain in thoracic spine: Secondary | ICD-10-CM | POA: Insufficient documentation

## 2021-04-08 DIAGNOSIS — Z5321 Procedure and treatment not carried out due to patient leaving prior to being seen by health care provider: Secondary | ICD-10-CM | POA: Insufficient documentation

## 2021-04-08 NOTE — ED Notes (Signed)
Pt states he would like to leave, PA made aware, will go speak with patient.

## 2021-04-08 NOTE — ED Provider Notes (Signed)
I presented to the room to evaluate the patient for his presenting complaint of mild back pain x6 hours, however when I arrived to the room the patient stated that he had been waiting too long to be seen and he no longer wish to be evaluated in the emergency department for evaluation.  I did communicate with him that I am the emergency medicine provider and was happy to examine him and treat him as appropriate should he wish to remain in the ED a little while longer. He clearly stated that he did not wish to be examined any longer, that he just wanted to leave the emergency department and go home.  It was discussed with patient that given he refused examination and evaluation, this provider cannot medically clear him for discharge, however he has the right to leave the ED should he desire to do so.  He states he was leaving immediately and eloped from the department; he is welcome back in the emergency department at any time he should require medical treatment.  This chart was dictated using voice recognition software, Dragon. Despite the best efforts of this provider to proofread and correct errors, errors may still occur which can change documentation meaning.     Paris Lore, PA-C 04/08/21 1651    Alvira Monday, MD 04/11/21 2342

## 2021-04-08 NOTE — ED Notes (Signed)
ED Provider at bedside. 

## 2021-04-08 NOTE — ED Notes (Signed)
Pt seen leaving ER with steady gate at this time.

## 2021-04-08 NOTE — ED Triage Notes (Signed)
C/o mid back pain x x 4 hrs

## 2021-05-17 ENCOUNTER — Emergency Department (HOSPITAL_BASED_OUTPATIENT_CLINIC_OR_DEPARTMENT_OTHER)
Admission: EM | Admit: 2021-05-17 | Discharge: 2021-05-17 | Disposition: A | Discharge status: Home / Self Care | Payer: Medicaid Other | Attending: Emergency Medicine | Admitting: Emergency Medicine

## 2021-05-17 ENCOUNTER — Encounter (HOSPITAL_BASED_OUTPATIENT_CLINIC_OR_DEPARTMENT_OTHER): Payer: Self-pay | Admitting: *Deleted

## 2021-05-17 ENCOUNTER — Other Ambulatory Visit: Payer: Self-pay

## 2021-05-17 DIAGNOSIS — S81812A Laceration without foreign body, left lower leg, initial encounter: Secondary | ICD-10-CM

## 2021-05-17 DIAGNOSIS — F17220 Nicotine dependence, chewing tobacco, uncomplicated: Secondary | ICD-10-CM | POA: Insufficient documentation

## 2021-05-17 DIAGNOSIS — W268XXA Contact with other sharp object(s), not elsewhere classified, initial encounter: Secondary | ICD-10-CM | POA: Insufficient documentation

## 2021-05-17 DIAGNOSIS — S81811A Laceration without foreign body, right lower leg, initial encounter: Secondary | ICD-10-CM | POA: Insufficient documentation

## 2021-05-17 MED ORDER — LIDOCAINE-EPINEPHRINE (PF) 2 %-1:200000 IJ SOLN
10.0000 mL | Freq: Once | INTRAMUSCULAR | Status: AC
  Administered 2021-05-17: 10 mL
  Filled 2021-05-17: qty 20

## 2021-05-17 NOTE — ED Triage Notes (Signed)
C/o right leg lac x 4 hrs ago ? Foreign body.

## 2021-05-17 NOTE — ED Notes (Signed)
States rec laceration/ injury to RLE, tibal area, with a weed eater. CMS of RLE all WNL. States his tetanus is current. States he rec a tetanus in 2018.

## 2021-05-17 NOTE — Discharge Instructions (Addendum)
Keep your wound clean and dry. Continue to cover during work. Return to any urgent care or this department in 7-10 days for suture removal.

## 2021-05-17 NOTE — ED Notes (Signed)
ED Provider at bedside. 
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
## 2021-05-29 ENCOUNTER — Encounter (HOSPITAL_BASED_OUTPATIENT_CLINIC_OR_DEPARTMENT_OTHER): Payer: Self-pay

## 2021-05-29 ENCOUNTER — Encounter (HOSPITAL_BASED_OUTPATIENT_CLINIC_OR_DEPARTMENT_OTHER): Payer: Self-pay | Admitting: *Deleted

## 2021-05-29 ENCOUNTER — Emergency Department (HOSPITAL_BASED_OUTPATIENT_CLINIC_OR_DEPARTMENT_OTHER)
Admission: EM | Admit: 2021-05-29 | Discharge: 2021-05-29 | Disposition: A | Discharge status: Home / Self Care | Payer: Medicaid Other | Attending: Emergency Medicine | Admitting: Emergency Medicine

## 2021-05-29 ENCOUNTER — Other Ambulatory Visit: Payer: Self-pay

## 2021-05-29 DIAGNOSIS — Z4802 Encounter for removal of sutures: Secondary | ICD-10-CM | POA: Insufficient documentation

## 2021-05-29 DIAGNOSIS — F17220 Nicotine dependence, chewing tobacco, uncomplicated: Secondary | ICD-10-CM | POA: Insufficient documentation

## 2021-05-29 DIAGNOSIS — Z5321 Procedure and treatment not carried out due to patient leaving prior to being seen by health care provider: Secondary | ICD-10-CM | POA: Insufficient documentation

## 2021-05-29 NOTE — ED Notes (Signed)
Called pt in lobby, bistro area, and outside with no response

## 2021-05-29 NOTE — Discharge Instructions (Addendum)
Get help right away if: You have a fever. You have redness that is spreading from your wound. 

## 2021-05-29 NOTE — ED Provider Notes (Signed)
[facility] EMERGENCY DEPARTMENT Provider Note   CSN: [id number] Arrival date & time: 05/29/21  1834     History Chief Complaint  Patient presents with   Suture / Staple Removal    Steven Pace is a 45 y.o. male who presents emergency department who for suture removal.  He had 3 sutures placed on 05/17/2021.  He denies any active pain or discharge.   Suture / Staple Removal      Past Medical History:  Diagnosis Date   Methamphetamine abuse (HCC)     There are no problems to display for this patient.   Past Surgical History:  Procedure Laterality Date   APPENDECTOMY     EYE SURGERY     KNEE ARTHROSCOPY W/ MENISCAL REPAIR         Family History  Problem Relation Age of Onset   Cancer Other    CAD Other     Social History   Tobacco Use   Smoking status: Never   Smokeless tobacco: Current    Types: Chew, Snuff  Vaping Use   Vaping Use: Never used  Substance Use Topics   Alcohol use: No   Drug use: Yes    Types: Methamphetamines    Home Medications Prior to Admission medications   Medication Sig Start Date End Date Taking? Authorizing Provider  cephALEXin (KEFLEX) 500 MG capsule Take 1 capsule (500 mg total) by mouth 4 (four) times daily. 12/30/19   Dartha Lodge, PA-C  hydrOXYzine (ATARAX/VISTARIL) 50 MG tablet Take 50 mg by mouth 2 (two) times a day.    [provider]  naproxen (NAPROSYN) 500 MG tablet Take 1 tablet (500 mg total) by mouth 2 (two) times daily. 10/10/19   Petrucelli, Samantha R, PA-C  omeprazole (PRILOSEC) 40 MG capsule Take 1 capsule (40 mg total) by mouth daily. 06/01/19   Ward, Layla Maw, DO  ondansetron (ZOFRAN) 4 MG tablet Take 1 tablet (4 mg total) by mouth every 6 (six) hours as needed for nausea or vomiting. 06/01/19   Ward, Layla Maw, DO  silver sulfADIAZINE (SILVADENE) 1 % cream Apply 1 application topically daily. 10/10/19   Petrucelli, Samantha R, PA-C  TESTOSTERONE IM Inject into the muscle.    [provider]    Allergies    Ambien [zolpidem tartrate]  Review of Systems   Review of Systems  Constitutional:  Negative for chills and fever.  Skin:  Positive for wound.   Physical Exam Updated Vital Signs BP 109/79 (BP Location: Left Arm)   Pulse (!) 102   Resp 20   Ht 5\' 8"  (1.727 m)   Wt 68 kg   SpO2 100%   BMI 22.81 kg/m   Physical Exam Physical Exam  Nursing note and vitals reviewed. Constitutional: He appears well-developed and well-nourished. No distress.  HENT:  Head: Normocephalic and atraumatic.  Eyes: Conjunctivae normal are normal. No scleral icterus.  Neck: Normal range of motion. Neck supple.  Cardiovascular: Normal rate, regular rhythm and normal heart sounds.   Pulmonary/Chest: Effort normal and breath sounds normal. No respiratory distress.  Abdominal: Soft. There is no tenderness.  Musculoskeletal: He exhibits no edema.  Neurological: He is alert.  Skin: Skin is warm and dry. He is not diaphoretic.  3 sutures in place Psychiatric: His behavior is normal.   ED Results / Procedures / Treatments   Labs (all labs ordered are listed, but only abnormal results are displayed) Labs Reviewed - No data to display  EKG None  Radiology No results found.  Procedures .Suture Removal  Date/Time: 05/29/2021 7:47 PM Performed by: Arthor Captain, PA-C Authorized by: Arthor Captain, PA-C   Consent:    Consent obtained:  Verbal   Consent given by:  Patient Universal protocol:    Patient identity confirmed:  Verbally with patient Procedure details:    Wound appearance:  No signs of infection   Number of sutures removed:  3 Post-procedure details:    Procedure completion:  Tolerated well, no immediate complications   Medications Ordered in ED Medications - No data to display  ED Course  I have reviewed the triage vital signs and the nursing notes.  Pertinent labs & imaging results that were available during my care of the patient were reviewed  by me and considered in my medical decision making (see chart for details).    MDM Rules/Calculators/A&P                           Staple removal   Pt to ER for staple/suture removal and wound check as above. Procedure tolerated well. Vitals normal, no signs of infection. Scar minimization & return precautions given at dc.   Final Clinical Impression(s) / ED Diagnoses Final diagnoses:  Visit for suture removal    Rx / DC Orders ED Discharge Orders     None        Arthor Captain, PA-C 05/29/21 1950    Cheryll Cockayne, MD 06/01/21 [phone]

## 2021-05-29 NOTE — ED Triage Notes (Signed)
Here for suture removal

## 2021-05-29 NOTE — ED Triage Notes (Signed)
Pt had sutures placed 8/5, here for removal.

## 2022-04-22 ENCOUNTER — Encounter (HOSPITAL_BASED_OUTPATIENT_CLINIC_OR_DEPARTMENT_OTHER): Payer: Self-pay | Admitting: Urology

## 2022-04-22 ENCOUNTER — Other Ambulatory Visit: Payer: Self-pay

## 2022-04-22 ENCOUNTER — Emergency Department (HOSPITAL_BASED_OUTPATIENT_CLINIC_OR_DEPARTMENT_OTHER): Payer: Self-pay

## 2022-04-22 ENCOUNTER — Emergency Department (HOSPITAL_BASED_OUTPATIENT_CLINIC_OR_DEPARTMENT_OTHER)
Admission: EM | Admit: 2022-04-22 | Discharge: 2022-04-22 | Disposition: A | Discharge status: Home / Self Care | Payer: Self-pay | Attending: Emergency Medicine | Admitting: Emergency Medicine

## 2022-04-22 DIAGNOSIS — R42 Dizziness and giddiness: Secondary | ICD-10-CM | POA: Insufficient documentation

## 2022-04-22 DIAGNOSIS — R079 Chest pain, unspecified: Secondary | ICD-10-CM | POA: Insufficient documentation

## 2022-04-22 DIAGNOSIS — Z79899 Other long term (current) drug therapy: Secondary | ICD-10-CM | POA: Insufficient documentation

## 2022-04-22 DIAGNOSIS — E860 Dehydration: Secondary | ICD-10-CM | POA: Insufficient documentation

## 2022-04-22 DIAGNOSIS — R55 Syncope and collapse: Secondary | ICD-10-CM | POA: Insufficient documentation

## 2022-04-22 LAB — BASIC METABOLIC PANEL
Anion gap: 13 (ref 5–15)
BUN: 16 mg/dL (ref 6–20)
CO2: 21 mmol/L — ABNORMAL LOW (ref 22–32)
Calcium: 9.9 mg/dL (ref 8.9–10.3)
Chloride: 104 mmol/L (ref 98–111)
Creatinine, Ser: 1.41 mg/dL — ABNORMAL HIGH (ref 0.61–1.24)
GFR, Estimated: >60 mL/min (ref >60)
Glucose, Bld: 103 mg/dL — ABNORMAL HIGH (ref 70–99)
Potassium: 3.7 mmol/L (ref 3.5–5.1)
Sodium: 138 mmol/L (ref 135–145)

## 2022-04-22 LAB — CBC
HCT: 43.5 % (ref 39.0–52.0)
Hemoglobin: 15.6 g/dL (ref 13.0–17.0)
MCH: 31.9 pg (ref 26.0–34.0)
MCHC: 35.9 g/dL (ref 30.0–36.0)
MCV: 89 fL (ref 80.0–100.0)
Platelets: 347 10*3/uL (ref 150–400)
RBC: 4.89 MIL/uL (ref 4.22–5.81)
RDW: 11.4 % — ABNORMAL LOW (ref 11.5–15.5)
WBC: 9.5 10*3/uL (ref 4.0–10.5)
nRBC: 0 % (ref 0.0–0.2)

## 2022-04-22 LAB — TROPONIN I (HIGH SENSITIVITY)
Troponin I (High Sensitivity): 3 ng/L (ref <18)
Troponin I (High Sensitivity): 3 ng/L (ref <18)

## 2022-04-22 MED ORDER — SODIUM CHLORIDE 0.9 % IV BOLUS
1000.0000 mL | Freq: Once | INTRAVENOUS | Status: AC
  Administered 2022-04-22: 1000 mL via INTRAVENOUS

## 2022-04-22 NOTE — ED Provider Notes (Signed)
[facility] EMERGENCY DEPARTMENT Provider Note   CSN: [id number] Arrival date & time: 04/22/22  1453     History  Chief Complaint  Patient presents with   Near Syncope    Steven Pace is a 46 y.o. male with appendectomy, knee arthroscopy, methamphetamine use.  Patient presents to the ED for evaluation of lightheadedness, near syncopal event.  Patient states that he was walking from the [facility] [place] to [place] [employer] and that he became lightheaded and dizzy and slowly lowered himself down to the ground.  The patient denies passing out or hitting his head.  The patient states that this lasted about 5 minutes and he had associated episode of chest pain.  Patient states that the chest pain lasted for approximately 5 minutes and then resolved.  Patient states that the chest pain is located in the center of his chest, did not radiate, was not associated with shortness of breath, he is unsure if it is worsened by exertion.  Patient denies cardiac history however on chart review he was diagnosed with CAD. Patient denies nausea, vomiting, diarrhea, fevers, shortness of breath, syncope, abdominal pain, back pain.    Near Syncope Associated symptoms include chest pain. Pertinent negatives include no abdominal pain and no shortness of breath.       Home Medications Prior to Admission medications   Medication Sig Start Date End Date Taking? Authorizing Provider  cephALEXin (KEFLEX) 500 MG capsule Take 1 capsule (500 mg total) by mouth 4 (four) times daily. 12/30/19   Dartha Lodge, PA-C  hydrOXYzine (ATARAX/VISTARIL) 50 MG tablet Take 50 mg by mouth 2 (two) times a day.    [provider]  naproxen (NAPROSYN) 500 MG tablet Take 1 tablet (500 mg total) by mouth 2 (two) times daily. 10/10/19   Petrucelli, Samantha R, PA-C  omeprazole (PRILOSEC) 40 MG capsule Take 1 capsule (40 mg total) by mouth daily. 06/01/19   Ward, Layla Maw, DO  ondansetron (ZOFRAN) 4 MG tablet  Take 1 tablet (4 mg total) by mouth every 6 (six) hours as needed for nausea or vomiting. 06/01/19   Ward, Layla Maw, DO  silver sulfADIAZINE (SILVADENE) 1 % cream Apply 1 application topically daily. 10/10/19   Petrucelli, Samantha R, PA-C  TESTOSTERONE IM Inject into the muscle.    [provider]      Allergies    Ambien [zolpidem tartrate]    Review of Systems   Review of Systems  Constitutional:  Negative for chills and fever.  Respiratory:  Negative for shortness of breath.   Cardiovascular:  Positive for chest pain and near-syncope. Negative for leg swelling.  Gastrointestinal:  Negative for abdominal pain, diarrhea, nausea and vomiting.  Musculoskeletal:  Negative for back pain.  Neurological:  Positive for dizziness and light-headedness. Negative for syncope.  All other systems reviewed and are negative.   Physical Exam Updated Vital Signs BP 112/79   Pulse 98   Temp 98 F (36.7 C) (Oral)   Resp (!) 21   Ht 5\' 8"  (1.727 m)   Wt 68 kg   SpO2 99%   BMI 22.79 kg/m  Physical Exam Vitals and nursing note reviewed.  Constitutional:      General: He is not in acute distress.    Appearance: Normal appearance. He is not ill-appearing, toxic-appearing or diaphoretic.  HENT:     Head: Normocephalic and atraumatic.     Nose: Nose normal. No congestion.     Mouth/Throat:  Mouth: Mucous membranes are moist.     Pharynx: Oropharynx is clear.  Eyes:     Extraocular Movements: Extraocular movements intact.     Conjunctiva/sclera: Conjunctivae normal.     Pupils: Pupils are equal, round, and reactive to light.  Cardiovascular:     Rate and Rhythm: Normal rate and regular rhythm.  Pulmonary:     Effort: Pulmonary effort is normal.     Breath sounds: Normal breath sounds. No wheezing.  Abdominal:     General: Abdomen is flat. Bowel sounds are normal.     Palpations: Abdomen is soft.     Tenderness: There is no abdominal tenderness.  Musculoskeletal:      Cervical back: Normal range of motion and neck supple. No tenderness.     Right lower leg: No edema.     Left lower leg: No edema.  Skin:    General: Skin is warm and dry.     Capillary Refill: Capillary refill takes less than 2 seconds.  Neurological:     Mental Status: He is alert and oriented to person, place, and time.     GCS: GCS eye subscore is 4. GCS verbal subscore is 5. GCS motor subscore is 6.     Cranial Nerves: Cranial nerves 2-12 are intact. No cranial nerve deficit.     Sensory: Sensation is intact. No sensory deficit.     Motor: Motor function is intact. No weakness.     Coordination: Coordination is intact. Heel to Shin Test normal.     ED Results / Procedures / Treatments   Labs (all labs ordered are listed, but only abnormal results are displayed) Labs Reviewed  BASIC METABOLIC PANEL - Abnormal; Notable for the following components:      Result Value   CO2 21 (*)    Glucose, Bld 103 (*)    Creatinine, Ser 1.41 (*)    All other components within normal limits  CBC - Abnormal; Notable for the following components:   RDW 11.4 (*)    All other components within normal limits  TROPONIN I (HIGH SENSITIVITY)  TROPONIN I (HIGH SENSITIVITY)    EKG None  Radiology DG Chest Port 1 View  Result Date: 04/22/2022 CLINICAL DATA:  Chest pain, lightheadedness. EXAM: PORTABLE CHEST 1 VIEW COMPARISON:  Chest x-ray dated 11/19/2019 FINDINGS: Heart size and mediastinal contours are within normal limits. Lungs are clear. No pleural effusion or pneumothorax is seen. Osseous structures about the chest are unremarkable. IMPRESSION: No active disease. Electronically Signed   By: Bary Richard M.D.   On: 04/22/2022 15:32    Procedures Procedures   Medications Ordered in ED Medications  sodium chloride 0.9 % bolus 1,000 mL (0 mLs Intravenous Stopped 04/22/22 1642)  sodium chloride 0.9 % bolus 1,000 mL (0 mLs Intravenous Stopped 04/22/22 1810)    ED Course/ Medical Decision Making/  A&P                           Medical Decision Making Amount and/or Complexity of Data Reviewed Labs: ordered. Radiology: ordered.   46 year old L presents to the ED for evaluation.  Please see HPI for further details.  On examination, the patient is tachycardic to 112, hypotensive.  Patient lung sounds clear bilaterally, not hypoxic on room air.  The patient's abdomen is soft and compressible all 4 quadrants.  Patient neurological examination shows no focal neurodeficits.  Patient is alert and oriented x3.  The patient follows  commands appropriately.  The patient is nontoxic in appearance.  Patient worked up utilizing the following labs and imaging studies interpreted by me personally: - Troponin initially 3, delta troponin still 3 - BMP with elevated creatinine 1.41, no baseline to compare to. - CBC unremarkable - Chest x-ray unremarkable, no mediastinal widening, effusion, consolidation - EKG nonischemic, sinus rhythm  Patient treated with 2 L normal saline.  After fluid, the patient's blood pressure improved to 112/79 and his heart rate decreased to 98.  At this time, the patient's work-up was unremarkable and unrevealing for any emergent cause.  The patient will be discharged home and advised to continue hydrating himself as an outpatient.  The patient will be referred to a PCP for further management as he states he does not have a PCP.  Patient was given return precautions and he voiced understanding.  The patient had all of his questions answered to his satisfaction.  The patient is stable at this time for discharge home.  Final Clinical Impression(s) / ED Diagnoses Final diagnoses:  Near syncope  Dehydration    Rx / DC Orders ED Discharge Orders     None         Clent Ridges 04/22/22 1820    Charlynne Pander, MD 04/22/22 2112

## 2022-04-22 NOTE — ED Notes (Signed)
Discharge instructions and recommendations reviewed. States understanding. Ambulatory upon discharge

## 2022-04-22 NOTE — ED Notes (Signed)
ED Provider at bedside. 

## 2022-04-22 NOTE — Discharge Instructions (Signed)
Please return to the ED with any new or worsening symptoms Please follow-up with the PCP I referred you to.  You will need to call and make an appointment to be seen. Please read attached informational guide concerning dehydration Please continue hydrating yourself as an outpatient

## 2022-04-22 NOTE — ED Triage Notes (Signed)
[place] states [place] and reports feeling lightheaded, states "went down to knees and saw stars", denies hitting head
